# Patient Record
Sex: Female | Born: 1960 | Race: Black or African American | Hispanic: No | Marital: Married | Smoking: Never smoker
Health system: Southern US, Community
[De-identification: ages and names within clinical notes are randomized; demographics above are authoritative.]

## PROBLEM LIST (undated history)

## (undated) DIAGNOSIS — D649 Anemia, unspecified: Secondary | ICD-10-CM

## (undated) DIAGNOSIS — H409 Unspecified glaucoma: Secondary | ICD-10-CM

## (undated) DIAGNOSIS — N946 Dysmenorrhea, unspecified: Secondary | ICD-10-CM

## (undated) DIAGNOSIS — B009 Herpesviral infection, unspecified: Secondary | ICD-10-CM

## (undated) DIAGNOSIS — Z87442 Personal history of urinary calculi: Secondary | ICD-10-CM

## (undated) DIAGNOSIS — E041 Nontoxic single thyroid nodule: Secondary | ICD-10-CM

## (undated) HISTORY — DX: Herpesviral infection, unspecified: B00.9

## (undated) HISTORY — DX: Nontoxic single thyroid nodule: E04.1

## (undated) HISTORY — PX: FOOT SURGERY: SHX648

## (undated) HISTORY — DX: Unspecified glaucoma: H40.9

## (undated) HISTORY — PX: BREAST SURGERY: SHX581

## (undated) HISTORY — PX: NOVASURE ABLATION: SHX5394

## (undated) HISTORY — DX: Dysmenorrhea, unspecified: N94.6

## (undated) HISTORY — DX: Anemia, unspecified: D64.9

---

## 1998-01-02 ENCOUNTER — Ambulatory Visit (HOSPITAL_COMMUNITY): Admission: RE | Admit: 1998-01-02 | Discharge: 1998-01-02 | Payer: Self-pay | Admitting: Obstetrics and Gynecology

## 1999-11-09 ENCOUNTER — Other Ambulatory Visit: Admission: RE | Admit: 1999-11-09 | Discharge: 1999-11-09 | Payer: Self-pay | Admitting: Obstetrics and Gynecology

## 1999-11-10 ENCOUNTER — Encounter (INDEPENDENT_AMBULATORY_CARE_PROVIDER_SITE_OTHER): Payer: Self-pay

## 1999-11-10 ENCOUNTER — Other Ambulatory Visit: Admission: RE | Admit: 1999-11-10 | Discharge: 1999-11-10 | Payer: Self-pay | Admitting: Obstetrics and Gynecology

## 2001-12-26 ENCOUNTER — Other Ambulatory Visit: Admission: RE | Admit: 2001-12-26 | Discharge: 2001-12-26 | Payer: Self-pay | Admitting: Obstetrics and Gynecology

## 2002-01-02 ENCOUNTER — Ambulatory Visit (HOSPITAL_COMMUNITY): Admission: RE | Admit: 2002-01-02 | Discharge: 2002-01-02 | Payer: Self-pay | Admitting: Obstetrics and Gynecology

## 2002-01-02 ENCOUNTER — Encounter: Payer: Self-pay | Admitting: Obstetrics and Gynecology

## 2002-10-17 HISTORY — PX: BREAST EXCISIONAL BIOPSY: SUR124

## 2003-01-07 ENCOUNTER — Other Ambulatory Visit: Admission: RE | Admit: 2003-01-07 | Discharge: 2003-01-07 | Payer: Self-pay | Admitting: Obstetrics and Gynecology

## 2003-01-10 ENCOUNTER — Encounter: Payer: Self-pay | Admitting: Obstetrics and Gynecology

## 2003-01-10 ENCOUNTER — Ambulatory Visit (HOSPITAL_COMMUNITY): Admission: RE | Admit: 2003-01-10 | Discharge: 2003-01-10 | Payer: Self-pay | Admitting: Obstetrics and Gynecology

## 2004-01-15 ENCOUNTER — Other Ambulatory Visit: Admission: RE | Admit: 2004-01-15 | Discharge: 2004-01-15 | Payer: Self-pay | Admitting: Obstetrics and Gynecology

## 2004-01-15 ENCOUNTER — Ambulatory Visit (HOSPITAL_COMMUNITY): Admission: RE | Admit: 2004-01-15 | Discharge: 2004-01-15 | Payer: Self-pay | Admitting: Obstetrics and Gynecology

## 2004-12-24 ENCOUNTER — Other Ambulatory Visit: Admission: RE | Admit: 2004-12-24 | Discharge: 2004-12-24 | Payer: Self-pay | Admitting: Obstetrics and Gynecology

## 2005-01-21 ENCOUNTER — Ambulatory Visit (HOSPITAL_COMMUNITY): Admission: RE | Admit: 2005-01-21 | Discharge: 2005-01-21 | Payer: Self-pay | Admitting: Obstetrics and Gynecology

## 2006-01-23 ENCOUNTER — Other Ambulatory Visit: Admission: RE | Admit: 2006-01-23 | Discharge: 2006-01-23 | Payer: Self-pay | Admitting: Obstetrics and Gynecology

## 2006-01-26 ENCOUNTER — Ambulatory Visit (HOSPITAL_COMMUNITY): Admission: RE | Admit: 2006-01-26 | Discharge: 2006-01-26 | Payer: Self-pay | Admitting: Obstetrics and Gynecology

## 2007-01-29 ENCOUNTER — Ambulatory Visit (HOSPITAL_COMMUNITY): Admission: RE | Admit: 2007-01-29 | Discharge: 2007-01-29 | Payer: Self-pay | Admitting: Obstetrics and Gynecology

## 2008-01-30 ENCOUNTER — Ambulatory Visit (HOSPITAL_COMMUNITY): Admission: RE | Admit: 2008-01-30 | Discharge: 2008-01-30 | Payer: Self-pay | Admitting: Obstetrics and Gynecology

## 2009-01-30 ENCOUNTER — Ambulatory Visit (HOSPITAL_COMMUNITY): Admission: RE | Admit: 2009-01-30 | Discharge: 2009-01-30 | Payer: Self-pay | Admitting: Obstetrics and Gynecology

## 2010-02-04 ENCOUNTER — Ambulatory Visit (HOSPITAL_COMMUNITY): Admission: RE | Admit: 2010-02-04 | Discharge: 2010-02-04 | Payer: Self-pay | Admitting: Obstetrics and Gynecology

## 2010-12-10 ENCOUNTER — Other Ambulatory Visit (HOSPITAL_COMMUNITY): Payer: Self-pay | Admitting: Obstetrics and Gynecology

## 2010-12-10 DIAGNOSIS — Z1231 Encounter for screening mammogram for malignant neoplasm of breast: Secondary | ICD-10-CM

## 2011-02-10 ENCOUNTER — Ambulatory Visit (HOSPITAL_COMMUNITY)
Admission: RE | Admit: 2011-02-10 | Discharge: 2011-02-10 | Disposition: A | Discharge status: Home / Self Care | Payer: 59 | Source: Ambulatory Visit | Attending: Obstetrics and Gynecology | Admitting: Obstetrics and Gynecology

## 2011-02-10 DIAGNOSIS — Z1231 Encounter for screening mammogram for malignant neoplasm of breast: Secondary | ICD-10-CM | POA: Insufficient documentation

## 2011-02-11 ENCOUNTER — Other Ambulatory Visit: Payer: Self-pay | Admitting: Obstetrics and Gynecology

## 2011-02-11 DIAGNOSIS — R928 Other abnormal and inconclusive findings on diagnostic imaging of breast: Secondary | ICD-10-CM

## 2011-02-16 ENCOUNTER — Ambulatory Visit
Admission: RE | Admit: 2011-02-16 | Discharge: 2011-02-16 | Disposition: A | Discharge status: Home / Self Care | Payer: 59 | Source: Ambulatory Visit | Attending: Obstetrics and Gynecology | Admitting: Obstetrics and Gynecology

## 2011-02-16 DIAGNOSIS — R928 Other abnormal and inconclusive findings on diagnostic imaging of breast: Secondary | ICD-10-CM

## 2012-01-05 ENCOUNTER — Other Ambulatory Visit: Payer: Self-pay | Admitting: Obstetrics and Gynecology

## 2012-01-05 DIAGNOSIS — Z1231 Encounter for screening mammogram for malignant neoplasm of breast: Secondary | ICD-10-CM

## 2012-02-10 ENCOUNTER — Ambulatory Visit: Payer: Self-pay | Admitting: Obstetrics and Gynecology

## 2012-02-13 ENCOUNTER — Ambulatory Visit (HOSPITAL_COMMUNITY)
Admission: RE | Admit: 2012-02-13 | Discharge: 2012-02-13 | Disposition: A | Discharge status: Home / Self Care | Payer: 59 | Source: Ambulatory Visit | Attending: Obstetrics and Gynecology | Admitting: Obstetrics and Gynecology

## 2012-02-13 DIAGNOSIS — Z1231 Encounter for screening mammogram for malignant neoplasm of breast: Secondary | ICD-10-CM | POA: Insufficient documentation

## 2012-02-15 ENCOUNTER — Ambulatory Visit: Payer: Self-pay | Admitting: Obstetrics and Gynecology

## 2012-02-17 ENCOUNTER — Encounter: Payer: Self-pay | Admitting: Obstetrics and Gynecology

## 2012-02-17 ENCOUNTER — Ambulatory Visit (INDEPENDENT_AMBULATORY_CARE_PROVIDER_SITE_OTHER): Payer: 59 | Admitting: Obstetrics and Gynecology

## 2012-02-17 VITALS — BP 138/80 | Ht 66.5 in | Wt 213.0 lb

## 2012-02-17 DIAGNOSIS — Z01419 Encounter for gynecological examination (general) (routine) without abnormal findings: Secondary | ICD-10-CM

## 2012-02-17 DIAGNOSIS — D259 Leiomyoma of uterus, unspecified: Secondary | ICD-10-CM

## 2012-02-17 DIAGNOSIS — D219 Benign neoplasm of connective and other soft tissue, unspecified: Secondary | ICD-10-CM

## 2012-02-17 DIAGNOSIS — D649 Anemia, unspecified: Secondary | ICD-10-CM

## 2012-02-17 DIAGNOSIS — Z124 Encounter for screening for malignant neoplasm of cervix: Secondary | ICD-10-CM

## 2012-02-17 DIAGNOSIS — R635 Abnormal weight gain: Secondary | ICD-10-CM

## 2012-02-17 NOTE — Progress Notes (Signed)
Regular Periods: no Mammogram: yes  Monthly Breast Ex.: no Exercise: no  Tetanus < 10 years: yes Seatbelts: yes  NI. Bladder Functn.: yes Abuse at home: no  Daily BM's: yes Stressful Work: yes  Healthy Diet: yes Sigmoid-Colonoscopy: never time for colonscopy  Calcium: yes Medical problems this year: no problems   LAST PAP04/2012   nl Contraception:none pt  Has ablation  Mammogram: 02/15/2012  PCP: none PMH: no change FMH: no change Last Bone Scan:never  Pt is married

## 2012-02-17 NOTE — Progress Notes (Signed)
Subjective:    Kathleen Burton is a 51 y.o. female, G1P1, who presents for an annual exam. Patient has concerns about weight gain but admits to having very little control over eating.  Attributes much of this to her son going off to school and not having anything with which to fill her days.  Discussed her "empty nest" syndrome in terms of grieving.  Urged professional or support group assistance with this transition.      Menstrual cycle:   LMP: No LMP recorded. Patient has had an ablation.   Review of Systems Pertinent items are noted in HPI. Breast:Negative for breast lump,nipple discharge or nipple retraction Gastrointestinal: Negative for abdominal pain, change in bowel habits or rectal bleeding Genitourinary:no abnormal vaginal bleeding, urinary urgency, frequency, dysuria or hematuria   Objective:    BP 138/80  Ht 5' 6.5" (1.689 m)  Wt 213 lb (96.616 kg)  BMI 33.86 kg/m2   Wt Readings from Last 1 Encounters:  02/17/12 213 lb (96.616 kg)          BMI: Body mass index is 33.86 kg/(m^2).  General Appearance: Alert, appropriate appearance for age. No acute distress HEENT: Grossly normal Neck / Thyroid: Supple, no masses, nodes or enlargement Lungs: clear to auscultation bilaterally Back: No CVA tenderness Breast Exam: No masses or nodes.No dimpling, nipple retraction or discharge Cardiovascular: Regular rate and rhythm. S1, S2, no murmur Gastrointestinal: Soft, non-tender, no masses or organomegaly Pelvic Exam: External genitalia: normal general appearance Vaginal: normal rugae Cervix: normal appearance Adnexa: normal bimanual exam Uterus: normal single, nontender Rectovaginal: normal rectal, no masses Lymphatic Exam: Non-palpable nodes in neck, clavicular, axillary, or inguinal regions Skin: no rash or abnormalities Neurologic: Normal gait and speech, no tremor  Psychiatric: Alert and oriented, appropriate affect.  Extremities: no clubbing, cyanosis or edema      Assessment:    Routine GYN Exam  Weight Gain Empty Nest Syndrome   Plan:  Encouraged professional or support group assistance with the "empty nest" transition  mammogram pap smear return annually or prn     Aryella Besecker, PA-C

## 2012-02-22 LAB — PAP IG W/ RFLX HPV ASCU

## 2012-03-20 ENCOUNTER — Encounter: Payer: Self-pay | Admitting: Obstetrics and Gynecology

## 2013-01-14 ENCOUNTER — Other Ambulatory Visit: Payer: Self-pay | Admitting: Obstetrics and Gynecology

## 2013-01-14 DIAGNOSIS — Z1231 Encounter for screening mammogram for malignant neoplasm of breast: Secondary | ICD-10-CM

## 2013-02-13 ENCOUNTER — Ambulatory Visit (HOSPITAL_COMMUNITY)
Admission: RE | Admit: 2013-02-13 | Discharge: 2013-02-13 | Disposition: A | Discharge status: Home / Self Care | Payer: 59 | Source: Ambulatory Visit | Attending: Obstetrics and Gynecology | Admitting: Obstetrics and Gynecology

## 2013-02-13 DIAGNOSIS — Z1231 Encounter for screening mammogram for malignant neoplasm of breast: Secondary | ICD-10-CM | POA: Insufficient documentation

## 2013-04-01 ENCOUNTER — Ambulatory Visit (INDEPENDENT_AMBULATORY_CARE_PROVIDER_SITE_OTHER): Payer: 59 | Admitting: Internal Medicine

## 2013-04-01 ENCOUNTER — Encounter: Payer: Self-pay | Admitting: Internal Medicine

## 2013-04-01 VITALS — BP 128/82 | HR 79 | Temp 98.0°F | Wt 177.0 lb

## 2013-04-01 DIAGNOSIS — E049 Nontoxic goiter, unspecified: Secondary | ICD-10-CM

## 2013-04-01 DIAGNOSIS — E010 Iodine-deficiency related diffuse (endemic) goiter: Secondary | ICD-10-CM

## 2013-04-01 DIAGNOSIS — E041 Nontoxic single thyroid nodule: Secondary | ICD-10-CM | POA: Insufficient documentation

## 2013-04-01 NOTE — Progress Notes (Signed)
  Subjective:    Patient ID: Kathleen Burton, female    DOB: 11/16/1960, 52 y.o.   MRN: [id number]  HPI Acute visit, new patient. About a week ago the patient noted a lump at  the anterior neck when she swallows. There lump is nontender and she is not sure if it is getting larger since last week.  Past Medical History  Diagnosis Date  . Anemia   . Herpes   . Dysmenorrhea   . Thyroid disease     tested by gyn, hyper , hypo?   Past Surgical History  Procedure Laterality Date  . Novasure ablation    . Breast surgery      biospy  . Foot surgery Bilateral    History   Social History  . Marital Status: Married    Spouse Name: N/A    Number of Children: 1  . Years of Education: N/A   Occupational History  . Tech [employer]   Social History Main Topics  . Smoking status: Never Smoker   . Smokeless tobacco: Never Used  . Alcohol Use: No  . Drug Use: No  . Sexually Active: Yes    Birth Control/ Protection: Other-see comments   Other Topics Concern  . Not on file   Social History Narrative  . No narrative on file   Family History  Problem Relation Age of Onset  . Hypertension Mother   . Leukemia Mother     decreased  . Diabetes Brother   . Hypertension Brother     decreased  . Breast cancer Maternal Aunt     deceased  . Breast cancer Cousin     deceased  . Hypothyroidism Sister     had thyroid surgery  . Hypothyroidism Other      Review of Systems No  fever chills. Not recent URI. Weight is at baseline within few pounds. Denies any difficulty breathing or difficulty swallowing.     Objective:   Physical Exam BP 128/82  Pulse 79  Temp(Src) 98 F (36.7 C) (Oral)  Wt 177 lb (80.287 kg)  BMI 28.14 kg/m2  SpO2 97%  General -- alert, well-developed,NAD.   Neck --no lymphadenopathies,  on exam, when she swallows and elbow to palpate a thyroid gland, it is enlarged, nontender, not nodular, firm but not hard.  Lungs -- normal respiratory effort,  no intercostal retractions, no accessory muscle use, and normal breath sounds.   Heart-- normal rate, regular rhythm, no murmur, and no gallop.    Neurologic-- alert & oriented X3 and strength normal in all extremities. Psych-- Cognition and judgment appear intact. Alert and cooperative with normal attention span and concentration.  not anxious appearing and not depressed appearing.           Assessment & Plan:

## 2013-04-01 NOTE — Assessment & Plan Note (Signed)
Patient is found to have thyromegaly, become aware of this last week. Etiology unclear. Patient is concerned about cancer, will proceed with ultrasound and labs. Further advice would result .

## 2013-04-01 NOTE — Patient Instructions (Addendum)
Will schedule a thyroid ultrasound, will also call you with the results of labs

## 2013-04-02 ENCOUNTER — Ambulatory Visit: Payer: 59 | Admitting: Family Medicine

## 2013-04-02 LAB — CBC WITH DIFFERENTIAL/PLATELET
Basophils Relative: 1.4 % (ref 0.0–3.0)
Eosinophils Absolute: 0.1 10*3/uL (ref 0.0–0.7)
HCT: 38 % (ref 36.0–46.0)
Hemoglobin: 12.3 g/dL (ref 12.0–15.0)
Lymphocytes Relative: 28.1 % (ref 12.0–46.0)
Lymphs Abs: 1.4 10*3/uL (ref 0.7–4.0)
MCHC: 32.3 g/dL (ref 30.0–36.0)
MCV: 81 fl (ref 78.0–100.0)
Monocytes Absolute: 0.2 10*3/uL (ref 0.1–1.0)
Neutro Abs: 3.3 10*3/uL (ref 1.4–7.7)
RBC: 4.69 Mil/uL (ref 3.87–5.11)
RDW: 14.5 % (ref 11.5–14.6)

## 2013-04-02 LAB — BASIC METABOLIC PANEL
BUN: 14 mg/dL (ref 6–23)
Calcium: 10.5 mg/dL (ref 8.4–10.5)
Chloride: 112 mEq/L (ref 96–112)
Creatinine, Ser: 0.9 mg/dL (ref 0.4–1.2)

## 2013-04-02 LAB — T3, FREE: T3, Free: 2.5 pg/mL (ref 2.3–4.2)

## 2013-04-03 ENCOUNTER — Encounter: Payer: Self-pay | Admitting: Internal Medicine

## 2013-04-05 ENCOUNTER — Ambulatory Visit (HOSPITAL_COMMUNITY): Payer: 59

## 2013-04-05 ENCOUNTER — Ambulatory Visit (HOSPITAL_COMMUNITY)
Admission: RE | Admit: 2013-04-05 | Discharge: 2013-04-05 | Disposition: A | Discharge status: Home / Self Care | Payer: 59 | Source: Ambulatory Visit | Attending: Internal Medicine | Admitting: Internal Medicine

## 2013-04-05 DIAGNOSIS — E049 Nontoxic goiter, unspecified: Secondary | ICD-10-CM | POA: Insufficient documentation

## 2013-04-05 DIAGNOSIS — E010 Iodine-deficiency related diffuse (endemic) goiter: Secondary | ICD-10-CM

## 2013-04-09 ENCOUNTER — Telehealth: Payer: Self-pay | Admitting: *Deleted

## 2013-04-09 DIAGNOSIS — E041 Nontoxic single thyroid nodule: Secondary | ICD-10-CM

## 2013-04-09 NOTE — Telephone Encounter (Signed)
Message copied by Nada Maclachlan on Tue Apr 09, 2013 11:48 AM ------      Message from: Willow Ora E      Created: Fri Apr 05, 2013  5:02 PM       Lillia Abed, please schedule a thyroid biopsy with interventional radiology--- dx thyroid nodule      The patient is very anxious, could they  do that next week??      ---      I discussed this w/ the patient, her TSH is a slightly suppressed , T3-T4 normal.      Repeat TFTs in 2 months.      Patient in agreement ------

## 2013-04-09 NOTE — Telephone Encounter (Signed)
Orders entered

## 2013-04-16 ENCOUNTER — Other Ambulatory Visit (HOSPITAL_COMMUNITY)
Admission: RE | Admit: 2013-04-16 | Discharge: 2013-04-16 | Disposition: A | Discharge status: Home / Self Care | Payer: 59 | Source: Ambulatory Visit | Attending: Interventional Radiology | Admitting: Interventional Radiology

## 2013-04-16 ENCOUNTER — Ambulatory Visit
Admission: RE | Admit: 2013-04-16 | Discharge: 2013-04-16 | Disposition: A | Discharge status: Home / Self Care | Payer: 59 | Source: Ambulatory Visit | Attending: Internal Medicine | Admitting: Internal Medicine

## 2013-04-16 DIAGNOSIS — E041 Nontoxic single thyroid nodule: Secondary | ICD-10-CM

## 2013-04-16 DIAGNOSIS — E049 Nontoxic goiter, unspecified: Secondary | ICD-10-CM | POA: Insufficient documentation

## 2013-04-24 ENCOUNTER — Telehealth: Payer: Self-pay | Admitting: Internal Medicine

## 2013-04-24 DIAGNOSIS — E041 Nontoxic single thyroid nodule: Secondary | ICD-10-CM

## 2013-04-24 NOTE — Telephone Encounter (Signed)
Advise patient, biopsy showed benign tissue, this is good news. I do recommend her to see a specialist for consideration of treatment of the thyroid nodule. Please arrange a referral to see Dr. Everardo All.

## 2013-04-24 NOTE — Telephone Encounter (Signed)
Patient is calling to follow-up on the results from her [place] on 04/16/13.

## 2013-04-24 NOTE — Telephone Encounter (Signed)
Please advise on US results.

## 2013-04-25 NOTE — Telephone Encounter (Signed)
Discussed with patient, verbalized understanding. Referral orders placed.  

## 2013-04-25 NOTE — Telephone Encounter (Signed)
lmovm to return call  °

## 2013-05-01 ENCOUNTER — Encounter: Payer: Self-pay | Admitting: Endocrinology

## 2013-05-01 ENCOUNTER — Ambulatory Visit (INDEPENDENT_AMBULATORY_CARE_PROVIDER_SITE_OTHER): Payer: 59 | Admitting: Endocrinology

## 2013-05-01 VITALS — BP 118/74 | HR 75 | Resp 12 | Ht 66.0 in | Wt 186.1 lb

## 2013-05-01 DIAGNOSIS — E041 Nontoxic single thyroid nodule: Secondary | ICD-10-CM

## 2013-05-01 NOTE — Progress Notes (Signed)
Patient ID: Kathleen Burton, female   DOB: 11/16/1960, 52 y.o.   MRN: [id number]  History of Present Illness:  THYROID nodule: The patient apparently discovered a lump in neck 6//1214 while looking at the mirror. She did not have any local discomfort, pressure sensation and went to her primary care physician for evaluation. On examination she was felt to have a thyroid nodule and an ultrasound was done which showed a dominant nodule in the isthmus. She was then referred for biopsy which was benign and described below:  THYROID, FINE NEEDLE ASPIRATION, ISTHMUS MIDLINE: Adequacy Reason:  Satisfactory For Evaluation. Diagnosis: BENIGN. FINDINGS CONSISTENT WITH NON-NEOPLASTIC GOITER. (Case signed 04/17/2013) Specimen Clinical Information Dominant solid and partially cystic nodule in the midline isthmus 2.4 x 1.6 x 2.0 cm, Thyromegaly and thyroid goiter on physical examination Source Thyroid, Fine Needle Aspiration, Isthmus Midline  The patient was referred here for further evaluation and followup. She has multiple questions and concerns and has very little understanding of the thyroid nodule and prognosis Again she has had no local pressure or discomfort as well as no difficulty swallowing  Thyroid functions had shown low normal TSH but normal T4 and T3  Past Medical History  Diagnosis Date  . Anemia   . Herpes   . Dysmenorrhea   . Thyroid disease     tested by gyn, hyper , hypo?    Past Surgical History  Procedure Laterality Date  . Novasure ablation    . Breast surgery      biospy  . Foot surgery Bilateral     Family History  Problem Relation Age of Onset  . Hypertension Mother   . Leukemia Mother     decreased  . Diabetes Brother   . Hypertension Brother     decreased  . Breast cancer Maternal Aunt     deceased  . Breast cancer Cousin     deceased  . Hypothyroidism Sister     had thyroid surgery  . Hypothyroidism Other     Social History:  reports that she has  never smoked. She has never used smokeless tobacco. She reports that she does not drink alcohol or use illicit drugs.  Allergies:  Allergies  Allergen Reactions  . Latex Swelling      Medication List       This list is accurate as of: 05/01/13  9:46 AM.  Always use your most recent med list.               calcium carbonate 600 MG Tabs  Commonly known as:  OS-CAL  Take 600 mg by mouth 2 (two) times daily with a meal.     ferrous sulfate 325 (65 FE) MG tablet  Take 325 mg by mouth daily with breakfast.     multivitamin tablet  Take 1 tablet by mouth daily.         Lab Results  Component Value Date   TSH 0.31* 04/01/2013  .  REVIEW OF SYSTEMS:           No history of diabetes     Thyroid:  No cold or heat intolerance, unusual fatigue. She does have some tendency to weight gain      His blood pressure has been normal.     No swelling of feet.     No shortness of breath on exertion.    EXAM:   GENERAL:  No pallor, clubbing, lymphadenopathy or edema.    EYES::   external appearance normal  THYROID:  3 cm smooth firm midline nodule in the sternal notch, rest of the thyroid not palpable.No local stridor.  No lymphadenopathy in the neck  HEART:  Normal apex, S1 and S2; no murmur or click.  CHEST:    Vescicular breath sounds heard equally.  No crepitations/ wheeze.  NEUROLOGICAL: Biceps reflexes are brisk. No tremor   ASSESSMENT:   She has a benign thyroid nodule which is relatively prominent in the isthmus but causing no local pressure symptoms. Discussed that since the nodule is at least 2.5 cm in size it probably has grown slowly for quite some time. Reassured her that the thyroid nodule is benign and does not need surgical intervention. Discussed natural history of nodules and that usually they are slow-growing and would not need surgery unless there are very significantly enlarged and causing local pressure  Although her TSH is low normal she does not  meet criteria for subclinical hyperthyroidism but will need to be followed periodically. Discussed potential for treatment with I-131 if she hyperthyroidism   PLAN:   Since her TSH is borderline low She is not a candidate for thyroid suppression as a method of treatment She will be followed up in 4 months to reassess the size of her nodule clinically and repeat thyroid functions All questions answered  Kathleen Burton 05/01/2013, 9:46 AM

## 2013-05-01 NOTE — Patient Instructions (Signed)
No Treatment

## 2013-05-13 ENCOUNTER — Ambulatory Visit (INDEPENDENT_AMBULATORY_CARE_PROVIDER_SITE_OTHER): Payer: 59 | Admitting: Internal Medicine

## 2013-05-13 ENCOUNTER — Encounter: Payer: Self-pay | Admitting: Internal Medicine

## 2013-05-13 VITALS — BP 130/80 | HR 81 | Temp 97.5°F | Ht 66.5 in | Wt 187.8 lb

## 2013-05-13 DIAGNOSIS — Z0000 Encounter for general adult medical examination without abnormal findings: Secondary | ICD-10-CM | POA: Insufficient documentation

## 2013-05-13 DIAGNOSIS — E041 Nontoxic single thyroid nodule: Secondary | ICD-10-CM

## 2013-05-13 LAB — LIPID PANEL
HDL: 92.9 mg/dL (ref >39.00)
LDL Cholesterol: 92 mg/dL (ref 0–99)
Total CHOL/HDL Ratio: 2
Triglycerides: 39 mg/dL (ref 0.0–149.0)

## 2013-05-13 NOTE — Assessment & Plan Note (Signed)
Ultrasound of the thyroid showed a nodule, biopsy was benign. TSH was slightly suppressed. Saw endocrinology, he recommend observation for now and followup in 4 months.

## 2013-05-13 NOTE — Patient Instructions (Signed)
Please see your endocrinologist in 4 months Exercise   3 hours weekly Next visit with me in one year and as needed

## 2013-05-13 NOTE — Progress Notes (Signed)
  Subjective:    Patient ID: Kathleen Burton, female    DOB: 11/16/1960, 52 y.o.   MRN: [id number]  HPI Complete physical exam  Past Medical History  Diagnosis Date  . Anemia   . Herpes   . Dysmenorrhea   . Thyroid nodule     bx 04-2013   Past Surgical History  Procedure Laterality Date  . Novasure ablation    . Breast surgery      biospy  . Foot surgery Bilateral    History   Social History  . Marital Status: Married    Spouse Name: N/A    Number of Children: 1  . Years of Education: N/A   Occupational History  . dep. 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 Smoking status: Never Smoker   . Smokeless tobacco: Never Used  . Alcohol Use: No  . Drug Use: No  . Sexually Active: Yes    Birth Control/ Protection: Other-see comments   Other Topics Concern  . Not on file   Social History Narrative   Lives w/ husband, child is at college   Family History  Problem Relation Age of Onset  . Hypertension Mother     Judie Petit- brother   . Leukemia Mother     deceased  . Diabetes Brother   . Breast cancer Maternal Aunt     aunt-cousin  . Hypothyroidism Sister     had thyroid surgery  . Colon cancer Neg Hx   . CAD Neg Hx      Review of Systems Diet--healthy on and off Exercise-- does not exercise regularly. Denies chest pain or shortness or breath No  nausea, vomiting, diarrhea or blood in the stools. No dysuria or gross hematuria. Some stress, but no anxiety or depression.     Objective:   Physical Exam BP 130/80  Pulse 81  Temp(Src) 97.5 F (36.4 C) (Oral)  Ht 5' 6.5" (1.689 m)  Wt 187 lb 12.8 oz (85.186 kg)  BMI 29.86 kg/m2  SpO2 97%  General -- alert, well-developed, BMI .   Neck --mild thyromegaly , no tender or nodular  Lungs -- normal respiratory effort, no intercostal retractions, no accessory muscle use, and normal breath sounds.   Heart-- normal rate, regular rhythm, no murmur, and no gallop.    Abdomen--soft, non-tender, no distention, no masses, no HSM, no guarding, and no rigidity.   Extremities-- no pretibial edema bilaterally  Neurologic-- alert & oriented X3 and strength normal in all extremities. Psych-- Cognition and judgment appear intact. Alert and cooperative with normal attention span and concentration.  not anxious appearing and not depressed appearing.       Assessment & Plan:

## 2013-05-13 NOTE — Assessment & Plan Note (Addendum)
Tdap < 10 years per pt zostavax discussed  Never had a cscope, Colonoscopy versus Hemoccults discussed, IFOB provided, will call when ready for a colonoscopy. EKG for baseline--normal sinus rhythm Sees gynecology regularly Last mammogram 12-2012 normal. Diet exercise discussed Recent labs reviewed, no anemia, recommend to discontinue oral  iron. Check a cholesterol panel

## 2013-05-16 ENCOUNTER — Encounter: Payer: Self-pay | Admitting: *Deleted

## 2013-08-23 ENCOUNTER — Other Ambulatory Visit: Payer: Self-pay | Admitting: *Deleted

## 2013-08-23 ENCOUNTER — Other Ambulatory Visit (INDEPENDENT_AMBULATORY_CARE_PROVIDER_SITE_OTHER): Payer: 59

## 2013-08-23 DIAGNOSIS — E041 Nontoxic single thyroid nodule: Secondary | ICD-10-CM

## 2013-08-23 LAB — TSH: TSH: 0.12 u[IU]/mL — ABNORMAL LOW (ref 0.35–5.50)

## 2013-08-27 ENCOUNTER — Ambulatory Visit (INDEPENDENT_AMBULATORY_CARE_PROVIDER_SITE_OTHER): Payer: 59 | Admitting: Endocrinology

## 2013-08-27 ENCOUNTER — Other Ambulatory Visit: Payer: Self-pay | Admitting: *Deleted

## 2013-08-27 ENCOUNTER — Encounter: Payer: Self-pay | Admitting: Endocrinology

## 2013-08-27 VITALS — BP 126/78 | HR 77 | Temp 98.3°F | Resp 12 | Ht 66.0 in | Wt 202.8 lb

## 2013-08-27 DIAGNOSIS — E041 Nontoxic single thyroid nodule: Secondary | ICD-10-CM

## 2013-08-27 MED ORDER — PHENTERMINE-TOPIRAMATE ER 7.5-46 MG PO CP24: 7.5000 mg | ORAL_CAPSULE | Freq: Every day | ORAL | Status: DC

## 2013-08-27 MED ORDER — PHENTERMINE-TOPIRAMATE ER 3.75-23 MG PO CP24: 3.7500 mg | ORAL_CAPSULE | Freq: Every day | ORAL | Status: DC

## 2013-08-27 NOTE — Patient Instructions (Signed)
Start exercise with aerobic 45 min 4 days a week

## 2013-08-27 NOTE — Progress Notes (Signed)
Patient ID: Kathleen Burton, female   DOB: 11/16/1960, 52 y.o.   MRN: [id number]  Chief complaint:THYROID nodule  History of Present Illness:  She apparently discovered a lump in neck on 6//1214 while looking at the mirror. She did not have any local discomfort, pressure sensation and went to Kathleen Burton primary care physician for evaluation. On examination she was felt to have a thyroid nodule and an ultrasound was done which showed a dominant nodule in the isthmus measuring 2.4 cm. She was then referred for biopsy which was benign and described below:  THYROID, FINE NEEDLE ASPIRATION, ISTHMUS MIDLINE, 04/17/13: Adequacy Reason:  Satisfactory For Evaluation. Diagnosis: BENIGN. FINDINGS CONSISTENT WITH NON-NEOPLASTIC GOITER.  She has not had a thyroid scan  Again she has had no local pressure or discomfort as well as no difficulty swallowing Does not think she has noticed any change in the size of Kathleen Burton nodule.  Thyroid functions had shown low normal TSH but normal T4 and T3  Past Medical History  Diagnosis Date  . Anemia   . Herpes   . Dysmenorrhea   . Thyroid nodule     bx 04-2013    Past Surgical History  Procedure Laterality Date  . Novasure ablation    . Breast surgery      biospy  . Foot surgery Bilateral     Family History  Problem Relation Age of Onset  . Hypertension Mother     Judie Petit- brother   . Leukemia Mother     deceased  . Diabetes Brother   . Breast cancer Maternal Aunt     aunt-cousin  . Hypothyroidism Sister     had thyroid surgery  . Colon cancer Neg Hx   . CAD Neg Hx     Social History:  reports that she has never smoked. She has never used smokeless tobacco. She reports that she does not drink alcohol or use illicit drugs.  Allergies:  Allergies  Allergen Reactions  . Latex Swelling      Medication List       This list is accurate as of: 08/27/13 11:12 AM.  Always use your most recent med list.               calcium carbonate 600 MG Tabs  tablet  Commonly known as:  OS-CAL  Take 600 mg by mouth 2 (two) times daily with a meal.     multivitamin tablet  Take 1 tablet by mouth daily.         Lab Results  Component Value Date   TSH 0.12* 08/23/2013  .  REVIEW OF SYSTEMS:           No history of diabetes     His blood pressure has been normal.     She does have  tendency to weight gain. She has gained significant weight on this visit and is very upset about this She does not exercise but is wanting to take a weight loss medication to help Kathleen Burton start to weight loss process   Wt Readings from Last 3 Encounters:  08/27/13 202 lb 12.8 oz (91.989 kg)  05/13/13 187 lb 12.8 oz (85.186 kg)  05/01/13 186 lb 1.6 oz (84.414 kg)     EXAM:   THYROID:  3.2 cm smooth firm midline nodule in the sternal notch, rest of the thyroid not palpable.  No lymphadenopathy in the neck  NEUROLOGICAL: Biceps reflexes are normal   ASSESSMENT:   She has a benign thyroid nodule  which is relatively prominent in the isthmus but causing no local pressure symptoms. Clinically it appears about the same Again Kathleen Burton TSH is low and appears to be relatively lower than the last time but has a normal free T4 She does not have any signs or symptoms of hyperthyroidism She may possibly have warm/hot nodule but thyroid scan has not been done so far Discussed potential for treatment with I-131 if she hyperthyroidism   Weight gain: Advised the patient that this is not related to Kathleen Burton thyroid and she does need to do much better with exercise regimen as well as consider seeing the dietitian. She insists on starting a weight loss medication today  PLAN:    Continue followup of thyroid and will also check free T3 on the next visit She'll be started on Qsymia, initially with 3.75 mg for 2 weeks and then 7.5 mg Discussed how this works and possible side effects, since she is perimenopausal there is no concern for pregnancy Encouraged Kathleen Burton to start exercise  program  [facility] 08/27/2013, 11:12 AM

## 2013-09-02 ENCOUNTER — Ambulatory Visit: Payer: 59 | Admitting: Endocrinology

## 2013-09-09 ENCOUNTER — Telehealth: Payer: Self-pay | Admitting: *Deleted

## 2013-09-09 ENCOUNTER — Telehealth: Payer: Self-pay | Admitting: Endocrinology

## 2013-09-09 NOTE — Telephone Encounter (Signed)
Pt states she would like to speak with nurse Pt also states she has been calling past 2 weeks regarding her medication  Call back [phone]  Thank you :)

## 2013-09-09 NOTE — Telephone Encounter (Signed)
Patient wants to know if you can prescribe two different medications that would make up the ingredients of Qysmia, the Qysmia is to expensive. She has printed 4 different coupons and none of them work.   Please advise.

## 2013-09-10 ENCOUNTER — Other Ambulatory Visit: Payer: Self-pay | Admitting: *Deleted

## 2013-09-10 MED ORDER — TOPIRAMATE 50 MG PO TABS: ORAL_TABLET | ORAL | Status: DC

## 2013-09-10 MED ORDER — PHENTERMINE HCL 15 MG PO CAPS: 15.0000 mg | ORAL_CAPSULE | ORAL | Status: DC

## 2013-09-10 NOTE — Telephone Encounter (Signed)
Phentermine 15 mg every other day alternating with Topamax 50 mg, half to 2 weeks increase Topamax to daily and stay on phentermine every other day. She will need to followup in one month for office visit to review progress.

## 2013-09-10 NOTE — Telephone Encounter (Signed)
Patient aware, rx has been sent to her pharmacy

## 2013-10-07 ENCOUNTER — Ambulatory Visit: Payer: 59 | Admitting: Endocrinology

## 2013-10-08 ENCOUNTER — Encounter: Payer: Self-pay | Admitting: Endocrinology

## 2013-10-08 ENCOUNTER — Ambulatory Visit (INDEPENDENT_AMBULATORY_CARE_PROVIDER_SITE_OTHER): Payer: 59 | Admitting: Endocrinology

## 2013-10-08 ENCOUNTER — Other Ambulatory Visit: Payer: Self-pay | Admitting: *Deleted

## 2013-10-08 VITALS — BP 128/80 | HR 84 | Temp 98.3°F | Resp 12 | Ht 66.0 in | Wt 201.3 lb

## 2013-10-08 DIAGNOSIS — R635 Abnormal weight gain: Secondary | ICD-10-CM

## 2013-10-08 MED ORDER — PHENTERMINE HCL 15 MG PO CAPS: 15.0000 mg | ORAL_CAPSULE | Freq: Every day | ORAL | Status: DC

## 2013-10-08 NOTE — Progress Notes (Signed)
Kathleen Burton is a 52 y.o. female.   Chief complaint: Need for weight loss  History of Present Illness:   She had gained about 15 pounds on her last visit and was insisting on trying a weight loss medication She has not had any formal dietitian consultation and no exercise regimen She thinks she has a fairly good diet She was asked to try Qsymia but because of lack of coverage she was given the equivalent doses  in generic form She does not think it has changed her satiety and her weight has not come down; however has stabilized. No side effects from either medication  Wt Readings from Last 3 Encounters:  10/08/13 201 lb 4.8 oz (91.309 kg)  08/27/13 202 lb 12.8 oz (91.989 kg)  05/13/13 187 lb 12.8 oz (85.186 kg)     Medication List       This list is accurate as of: 10/08/13 11:50 AM.  Always use your most recent med list.               calcium carbonate 600 MG Tabs tablet  Commonly known as:  OS-CAL  Take 600 mg by mouth 2 (two) times daily with a meal.     multivitamin tablet  Take 1 tablet by mouth daily.     phentermine 15 MG capsule  Take 1 capsule (15 mg total) by mouth every other day.     topiramate 50 MG tablet  Commonly known as:  TOPAMAX  Take 1 tablet every other day for 2 weeks, then increase to 1 tablet daily        Allergies:  Allergies  Allergen Reactions  . Latex Swelling    Past Medical History  Diagnosis Date  . Anemia   . Herpes   . Dysmenorrhea   . Thyroid nodule     bx 04-2013    Past Surgical History  Procedure Laterality Date  . Novasure ablation    . Breast surgery      biospy  . Foot surgery Bilateral     Family History  Problem Relation Age of Onset  . Hypertension Mother     Judie Petit- brother   . Leukemia Mother     deceased  . Diabetes Brother   . Breast cancer Maternal Aunt     aunt-cousin  . Hypothyroidism Sister     had thyroid surgery  . Colon cancer Neg Hx   . CAD Neg Hx     Social History:  reports that  she has never smoked. She has never used smokeless tobacco. She reports that she does not drink alcohol or use illicit drugs.  Review of Systems   No visits with results within 1 Week(s) from this visit. Latest known visit with results is:  Appointment on 08/23/2013  Component Date Value Range Status  . TSH 08/23/2013 0.12* 0.35 - 5.50 uIU/mL Final  . Free T4 08/23/2013 1.07  0.60 - 1.60 ng/dL Final    EXAM:  BP 161/09  Pulse 97  Temp(Src) 98.3 F (36.8 C)  Resp 12  Ht 5\' 6"  (1.676 m)  Wt 201 lb 4.8 oz (91.309 kg)  BMI 32.51 kg/m2  SpO2 95%  Repeat pulse 80  Assessment/Plan:   Obesity: She is trying combination of low-dose phentermine and Topamax every other day which is not helping her weight loss However she has not been exercising more trying to watch her diet Discussed that higher dose phentermine is not recommended for long-term weight loss She  is currently only interested in medications that are covered by her insurance and have low co-pay  For now will increase her phentermine and Topamax every day which will be given to the next higher dose of Qsymia Given her information on Belviq and Contrave and she will call back if these are economical enough for her to use Also stressed the importance of starting exercise like walking program to help weight loss and maintain it long term  Chelcee Korpi 10/08/2013, 11:50 AM

## 2013-10-08 NOTE — Patient Instructions (Signed)
Take both drugs daily

## 2013-12-06 ENCOUNTER — Encounter: Payer: Self-pay | Admitting: Endocrinology

## 2013-12-06 ENCOUNTER — Ambulatory Visit (INDEPENDENT_AMBULATORY_CARE_PROVIDER_SITE_OTHER): Payer: 59 | Admitting: Endocrinology

## 2013-12-06 VITALS — BP 122/78 | HR 91 | Temp 98.2°F | Resp 16 | Ht 66.0 in | Wt 184.4 lb

## 2013-12-06 DIAGNOSIS — E669 Obesity, unspecified: Secondary | ICD-10-CM

## 2013-12-06 DIAGNOSIS — E041 Nontoxic single thyroid nodule: Secondary | ICD-10-CM

## 2013-12-06 MED ORDER — TOPIRAMATE 50 MG PO TABS: ORAL_TABLET | ORAL | Status: DC

## 2013-12-06 MED ORDER — PHENTERMINE HCL 15 MG PO CAPS: 15.0000 mg | ORAL_CAPSULE | Freq: Every day | ORAL | Status: DC

## 2013-12-06 NOTE — Progress Notes (Signed)
Kathleen Burton is a 53 y.o. female.    Chief complaint: Followup of weight loss treatment  History of Present Illness:   Because of a weight gain of about 15 pounds and difficulty with losing weight with diet and exercise regimen she had requested weight loss medications. She has not had any formal dietitian consultation  Also was somewhat unmotivated to exercise She was asked to try Qsymia but because of lack of coverage she was given the equivalent doses in generic form She thinks she has a fairly good diet With increasing the dose of phentermine to 15 mg daily and Topamax to 50 mg daily also she has better satiety She has no significant side effects except she thinks Topamax causes more nausea She also has started exercising some Her weight has come down significantly Overall she is feeling more motivated  Wt Readings from Last 3 Encounters:  12/06/13 184 lb 6.4 oz (83.643 kg)  10/08/13 201 lb 4.8 oz (91.309 kg)  08/27/13 202 lb 12.8 oz (91.989 kg)    Problem 2: Thyroid nodule which was biopsied in 04/2013 and was found to be benign. This measured about 3 cm on her l last exam Also has had mildly low TSH without high free T4; has not had a nuclear thyroid scan previously No complaints of palpitations or heat intolerance/shakiness      Medication List       This list is accurate as of: 12/06/13 11:59 PM.  Always use your most recent med list.               calcium carbonate 600 MG Tabs tablet  Commonly known as:  OS-CAL  Take 600 mg by mouth 2 (two) times daily with a meal.     multivitamin tablet  Take 1 tablet by mouth daily.     phentermine 15 MG capsule  Take 1 capsule (15 mg total) by mouth daily.     topiramate 50 MG tablet  Commonly known as:  TOPAMAX  Take 1 tablet every other day for 2 weeks, then increase to 1 tablet daily        Allergies:  Allergies  Allergen Reactions  . Latex Swelling    Past Medical History  Diagnosis Date  . Anemia    . Herpes   . Dysmenorrhea   . Thyroid nodule     bx 04-2013    Past Surgical History  Procedure Laterality Date  . Novasure ablation    . Breast surgery      biospy  . Foot surgery Bilateral     Family History  Problem Relation Age of Onset  . Hypertension Mother     Jerilynn Mages- brother   . Leukemia Mother     deceased  . Diabetes Brother   . Breast cancer Maternal Aunt     aunt-cousin  . Hypothyroidism Sister     had thyroid surgery  . Colon cancer Neg Hx   . CAD Neg Hx     Social History:  reports that she has never smoked. She has never used smokeless tobacco. She reports that she does not drink alcohol or use illicit drugs.  Review of Systems   No history of hypertension or diabetes  LABS:  No visits with results within 1 Week(s) from this visit. Latest known visit with results is:  Appointment on 08/23/2013  Component Date Value Ref Range Status  . TSH 08/23/2013 0.12* 0.35 - 5.50 uIU/mL Final  . Free T4 08/23/2013 1.07  0.60 - 1.60 ng/dL Final    EXAM:  BP 122/78  Pulse 91  Temp(Src) 98.2 F (36.8 C)  Resp 16  Ht 5\' 6"  (1.676 m)  Wt 184 lb 6.4 oz (83.643 kg)  BMI 29.78 kg/m2  SpO2 98%  Repeat pulse 80  Thyroid nodule is about 2.5-3 cm, smooth and firm, palpable in the isthmus area No local lymphadenopathy  Assessment/Plan:   Obesity: She is trying combination of low-dose phentermine and Topamax daily  which is now significantly helping her weight loss Also she has  been exercising recently and is pleased with the benefits. She has not had any side effects and appears to have no change in blood pressure and only minimal increase in pulse  She can continue the current regimen for another 6 months  THYROID nodule: Will check thyroid functions again to rule out subclinical hyperthyroidism  Taron Conrey 12/07/2013, 8:23 PM

## 2013-12-19 ENCOUNTER — Telehealth: Payer: Self-pay | Admitting: Endocrinology

## 2013-12-19 NOTE — Telephone Encounter (Signed)
Pt would like lab results   Call back [phone]  Thank You

## 2013-12-20 NOTE — Telephone Encounter (Signed)
Labs Back to normal now

## 2013-12-20 NOTE — Telephone Encounter (Signed)
Patient is requesting lab results.

## 2014-01-22 ENCOUNTER — Other Ambulatory Visit: Payer: Self-pay | Admitting: Obstetrics and Gynecology

## 2014-01-22 DIAGNOSIS — Z1231 Encounter for screening mammogram for malignant neoplasm of breast: Secondary | ICD-10-CM

## 2014-01-25 ENCOUNTER — Ambulatory Visit (INDEPENDENT_AMBULATORY_CARE_PROVIDER_SITE_OTHER): Payer: 59 | Admitting: Internal Medicine

## 2014-01-25 ENCOUNTER — Encounter: Payer: Self-pay | Admitting: Internal Medicine

## 2014-01-25 VITALS — BP 140/86 | HR 83 | Temp 97.7°F | Resp 20 | Ht 66.0 in | Wt 169.0 lb

## 2014-01-25 DIAGNOSIS — S8010XA Contusion of unspecified lower leg, initial encounter: Secondary | ICD-10-CM

## 2014-01-25 DIAGNOSIS — S8012XA Contusion of left lower leg, initial encounter: Secondary | ICD-10-CM

## 2014-01-25 NOTE — Assessment & Plan Note (Signed)
Reassured her this is not a "blood clot" (DVT) Should resolve on its own  Daily 5PM left thigh pain is certainly a mystery Discussed trying gentle stretching in AM and through the day May need follow up with Dr Larose Kells

## 2014-01-25 NOTE — Progress Notes (Signed)
Pre-visit discussion using our clinic review tool. No additional management support is needed unless otherwise documented below in the visit note.  

## 2014-01-25 NOTE — Progress Notes (Signed)
   Subjective:    Patient ID: Kathleen Burton, female    DOB: 11/16/1960, 53 y.o.   MRN: [id number]  HPI Has had some left leg pain  Daily for the last month Does okay in day but then thigh is "killing me" by the evening Gets shooting pains in evening--always starts at 5PM Pain is gone in the morning  Noticed a bruise in upper left calf a week ago Is tender Has grown since then  Worried about a blood clot  No regular exercise No known injury  Current Outpatient Prescriptions on File Prior to Visit  Medication Sig Dispense Refill  . calcium carbonate (OS-CAL) 600 MG TABS Take 600 mg by mouth 2 (two) times daily with a meal.      . Multiple Vitamin (MULTIVITAMIN) tablet Take 1 tablet by mouth daily.      . phentermine 15 MG capsule Take 1 capsule (15 mg total) by mouth daily.  30 capsule  2  . topiramate (TOPAMAX) 50 MG tablet Take 1 tablet every other day for 2 weeks, then increase to 1 tablet daily  21 tablet  5   No current facility-administered medications on file prior to visit.    Allergies  Allergen Reactions  . Latex Swelling    Past Medical History  Diagnosis Date  . Anemia   . Herpes   . Dysmenorrhea   . Thyroid nodule     bx 04-2013    Past Surgical History  Procedure Laterality Date  . Novasure ablation    . Breast surgery      biospy  . Foot surgery Bilateral     Family History  Problem Relation Age of Onset  . Hypertension Mother     Jerilynn Mages- brother   . Leukemia Mother     deceased  . Diabetes Brother   . Breast cancer Maternal Aunt     aunt-cousin  . Hypothyroidism Sister     had thyroid surgery  . Colon cancer Neg Hx   . CAD Neg Hx     History   Social History  . Marital Status: Married    Spouse Name: N/A    Number of Children: 1  . Years of Education: N/A   Occupational History  . dep. 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 Smoking status: Never Smoker   . Smokeless tobacco:  Never Used  . Alcohol Use: No  . Drug Use: No  . Sexual Activity: Yes    Birth Control/ Protection: Other-see comments   Other Topics Concern  . Not on file   Social History Narrative   Lives w/ husband, child is at college   Review of Systems Wears spanks all the time---not new No fever Hasn't been sick     Objective:   Physical Exam  Musculoskeletal:  Normal ROM in hips No quad tenderness Knees are normal  Small hematoma in upper posterior left calf No inflammation or tenderness  No calf swelling or tenderness (like in deep veins)          Assessment & Plan:

## 2014-02-05 ENCOUNTER — Encounter: Payer: Self-pay | Admitting: Endocrinology

## 2014-02-14 ENCOUNTER — Ambulatory Visit (HOSPITAL_COMMUNITY)
Admission: RE | Admit: 2014-02-14 | Discharge: 2014-02-14 | Disposition: A | Discharge status: Home / Self Care | Payer: 59 | Source: Ambulatory Visit | Attending: Obstetrics and Gynecology | Admitting: Obstetrics and Gynecology

## 2014-02-14 DIAGNOSIS — Z1231 Encounter for screening mammogram for malignant neoplasm of breast: Secondary | ICD-10-CM

## 2014-03-17 ENCOUNTER — Telehealth: Payer: Self-pay | Admitting: Endocrinology

## 2014-03-17 ENCOUNTER — Other Ambulatory Visit: Payer: Self-pay | Admitting: *Deleted

## 2014-03-17 MED ORDER — PHENTERMINE HCL 15 MG PO CAPS: 15.0000 mg | ORAL_CAPSULE | Freq: Every day | ORAL | Status: DC

## 2014-03-17 MED ORDER — TOPIRAMATE 50 MG PO TABS: ORAL_TABLET | ORAL | Status: DC

## 2014-03-17 NOTE — Telephone Encounter (Signed)
Pt will need refills on phentermine she has one refill left and it will be used this week; and the topiromate will need refills she only has 2 refills left and her appt is farther out.  Please change the quantity for the topiromate to 30

## 2014-03-18 ENCOUNTER — Other Ambulatory Visit: Payer: Self-pay | Admitting: *Deleted

## 2014-03-18 MED ORDER — PHENTERMINE HCL 15 MG PO CAPS: 15.0000 mg | ORAL_CAPSULE | Freq: Every day | ORAL | Status: DC

## 2014-03-18 NOTE — Telephone Encounter (Signed)
Please have written rx ready for pt for the phentermine. She does not want that called in. It is too expensive. Please call pt once this is done. Please cancel the rx for the phentermine at [facility].

## 2014-05-15 ENCOUNTER — Other Ambulatory Visit: Payer: Self-pay | Admitting: Endocrinology

## 2014-05-15 MED ORDER — TOPIRAMATE 50 MG PO TABS: 50.0000 mg | ORAL_TABLET | Freq: Two times a day (BID) | ORAL | Status: DC

## 2014-06-03 ENCOUNTER — Other Ambulatory Visit: Payer: 59

## 2014-06-03 ENCOUNTER — Other Ambulatory Visit (INDEPENDENT_AMBULATORY_CARE_PROVIDER_SITE_OTHER): Payer: 59

## 2014-06-03 DIAGNOSIS — E041 Nontoxic single thyroid nodule: Secondary | ICD-10-CM

## 2014-06-03 DIAGNOSIS — E669 Obesity, unspecified: Secondary | ICD-10-CM

## 2014-06-03 LAB — T3, FREE: T3, Free: 2.9 pg/mL (ref 2.3–4.2)

## 2014-06-03 LAB — TSH: TSH: 0.41 u[IU]/mL (ref 0.35–4.50)

## 2014-06-03 LAB — BASIC METABOLIC PANEL
BUN: 10 mg/dL (ref 6–23)
CO2: 22 meq/L (ref 19–32)
Calcium: 10.8 mg/dL — ABNORMAL HIGH (ref 8.4–10.5)
Chloride: 111 mEq/L (ref 96–112)
Creatinine, Ser: 0.7 mg/dL (ref 0.4–1.2)
GFR: 105.76 mL/min (ref >60.00)
GLUCOSE: 77 mg/dL (ref 70–99)
POTASSIUM: 3.9 meq/L (ref 3.5–5.1)
SODIUM: 142 meq/L (ref 135–145)

## 2014-06-03 LAB — T4, FREE: Free T4: 1.12 ng/dL (ref 0.60–1.60)

## 2014-06-06 ENCOUNTER — Other Ambulatory Visit: Payer: Self-pay | Admitting: Endocrinology

## 2014-06-06 ENCOUNTER — Other Ambulatory Visit: Payer: Self-pay | Admitting: *Deleted

## 2014-06-06 ENCOUNTER — Telehealth: Payer: Self-pay | Admitting: Endocrinology

## 2014-06-06 ENCOUNTER — Encounter: Payer: Self-pay | Admitting: Endocrinology

## 2014-06-06 ENCOUNTER — Ambulatory Visit (INDEPENDENT_AMBULATORY_CARE_PROVIDER_SITE_OTHER): Payer: 59 | Admitting: Endocrinology

## 2014-06-06 VITALS — BP 134/82 | HR 107 | Temp 97.6°F | Resp 14 | Ht 66.0 in | Wt 150.6 lb

## 2014-06-06 DIAGNOSIS — E669 Obesity, unspecified: Secondary | ICD-10-CM

## 2014-06-06 DIAGNOSIS — E041 Nontoxic single thyroid nodule: Secondary | ICD-10-CM

## 2014-06-06 MED ORDER — PHENTERMINE HCL 15 MG PO CAPS: 15.0000 mg | ORAL_CAPSULE | Freq: Every day | ORAL | Status: DC

## 2014-06-06 NOTE — Patient Instructions (Addendum)
Phenteremine every 2 days

## 2014-06-06 NOTE — Progress Notes (Addendum)
Kathleen Burton is a 53 y.o. female.    Chief complaint: Followup   History of Present Illness:   Problem 1: Because of a weight gain of about 15 pounds and difficulty with losing weight with diet and exercise regimen she wanted weight loss medications. She was asked to try Qsymia but because of lack of coverage she was given the equivalent doses in generic form She was started on low-dose phentermine every other day initially and Topamax With increasing the dose of phentermine to 15 mg daily and Topamax to 50 mg daily she has better satiety and has been he would've progressively lose weight, now 34 pounds. She is also exercising on treadmill at least 3 times a week and is more motivated  She has not had any formal dietitian consultation which he thinks he is really having healthy meals No side effects from medications currently  Wt Readings from Last 3 Encounters:  06/06/14 150 lb 9.6 oz (68.312 kg)  01/25/14 169 lb (76.658 kg)  12/06/13 184 lb 6.4 oz (83.643 kg)    Problem 2: Thyroid nodule which was biopsied in 04/2013 and was found to be benign. This measured about 3 cm previously on exam Also  had mildly low TSH without high free T4; has not had a nuclear thyroid scan previously Thyroid functions are now normal. No complaints of palpitations or heat intolerance/shakiness  Lab Results  Component Value Date   FREET4 1.12 06/03/2014   FREET4 1.07 08/23/2013   FREET4 1.01 04/01/2013   TSH 0.41 06/03/2014   TSH 0.12* 08/23/2013   TSH 0.31* 04/01/2013       Medication List       This list is accurate as of: 06/06/14  4:22 PM.  Always use your most recent med list.               calcium carbonate 600 MG Tabs tablet  Commonly known as:  OS-CAL  Take 600 mg by mouth 2 (two) times daily with a meal.     multivitamin tablet  Take 1 tablet by mouth daily.     phentermine 15 MG capsule  Take 1 capsule (15 mg total) by mouth daily.     topiramate 50 MG tablet  Commonly  known as:  TOPAMAX  Take 1 tablet (50 mg total) by mouth 2 (two) times daily.        Allergies:  Allergies  Allergen Reactions  . Latex Swelling    Past Medical History  Diagnosis Date  . Anemia   . Herpes   . Dysmenorrhea   . Thyroid nodule     bx 04-2013    Past Surgical History  Procedure Laterality Date  . Novasure ablation    . Breast surgery      biospy  . Foot surgery Bilateral     Family History  Problem Relation Age of Onset  . Hypertension Mother     Jerilynn Mages- brother   . Leukemia Mother     deceased  . Diabetes Brother   . Breast cancer Maternal Aunt     aunt-cousin  . Hypothyroidism Sister     had thyroid surgery  . Colon cancer Neg Hx   . CAD Neg Hx     Social History:  reports that she has never smoked. She has never used smokeless tobacco. She reports that she does not drink alcohol or use illicit drugs.  Review of Systems   No history of hypertension or diabetes  LABS:  Appointment on 06/03/2014  Component Date Value Ref Range Status  . T3, Free 06/03/2014 2.9  2.3 - 4.2 pg/mL Final  . Sodium 06/03/2014 142  135 - 145 mEq/L Final  . Potassium 06/03/2014 3.9  3.5 - 5.1 mEq/L Final  . Chloride 06/03/2014 111  96 - 112 mEq/L Final  . CO2 06/03/2014 22  19 - 32 mEq/L Final  . Glucose, Bld 06/03/2014 77  70 - 99 mg/dL Final  . BUN 06/03/2014 10  6 - 23 mg/dL Final  . Creatinine, Ser 06/03/2014 0.7  0.4 - 1.2 mg/dL Final  . Calcium 06/03/2014 10.8* 8.4 - 10.5 mg/dL Final  . GFR 06/03/2014 105.76  >60.00 mL/min Final  . TSH 06/03/2014 0.41  0.35 - 4.50 uIU/mL Final  . Free T4 06/03/2014 1.12  0.60 - 1.60 ng/dL Final    EXAM:  BP 134/82  Pulse 107  Temp(Src) 97.6 F (36.4 C)  Resp 14  Ht 5\' 6"  (1.676 m)  Wt 150 lb 9.6 oz (68.312 kg)  BMI 24.32 kg/m2  SpO2 98%  Repeat pulse 96  Thyroid nodule is about 2.5-3 cm, smooth and firm, palpable in the isthmus area No local lymphadenopathy  Assessment/Plan:   Obesity with baseline BMI 33:   She is on combination of low-dose phentermine and Topamax daily  which is continuing to significantly provide her weight loss Also she has  been exercising recently She has not had any side effects subjectively She appears to have resting tachycardia today even after seen in the office for sometime Discussed that phentermine may not be safe long-term and would like to reduce the dose to every other day especially since she has achieved significant weight loss  THYROID nodule: Benign as shown on previous biopsy Stable on exam and her TSH is back to normal   Horace Lukas 06/06/2014, 4:22 PM   Addendum: Calcium level was high, will need to repeat on next visit with PTH and vitamin D

## 2014-06-06 NOTE — Telephone Encounter (Signed)
Patient would like to know the correct instructions on how to take medsTomapax 50 mg and she need a new prescription. Please advise  [facility] [address]

## 2014-06-09 ENCOUNTER — Other Ambulatory Visit: Payer: Self-pay | Admitting: *Deleted

## 2014-06-09 MED ORDER — TOPIRAMATE 50 MG PO TABS: ORAL_TABLET | ORAL | Status: DC

## 2014-06-09 NOTE — Addendum Note (Signed)
Addended by: [name] on: 06/09/2014 02:55 PM   Modules accepted: Orders

## 2014-06-15 ENCOUNTER — Other Ambulatory Visit: Payer: Self-pay | Admitting: Endocrinology

## 2014-08-18 ENCOUNTER — Encounter: Payer: Self-pay | Admitting: Endocrinology

## 2014-10-06 ENCOUNTER — Ambulatory Visit (INDEPENDENT_AMBULATORY_CARE_PROVIDER_SITE_OTHER): Payer: 59 | Admitting: Endocrinology

## 2014-10-06 ENCOUNTER — Encounter: Payer: Self-pay | Admitting: Endocrinology

## 2014-10-06 ENCOUNTER — Other Ambulatory Visit: Payer: Self-pay | Admitting: *Deleted

## 2014-10-06 VITALS — BP 124/72 | HR 84 | Temp 98.1°F | Resp 14 | Ht 66.0 in | Wt 147.2 lb

## 2014-10-06 DIAGNOSIS — E041 Nontoxic single thyroid nodule: Secondary | ICD-10-CM

## 2014-10-06 MED ORDER — TOPIRAMATE 50 MG PO TABS: ORAL_TABLET | ORAL | Status: DC

## 2014-10-06 MED ORDER — PHENTERMINE HCL 15 MG PO CAPS: 15.0000 mg | ORAL_CAPSULE | Freq: Every day | ORAL | Status: DC

## 2014-10-06 NOTE — Patient Instructions (Addendum)
Take Topamax daily  Walk every 2 days

## 2014-10-06 NOTE — Progress Notes (Signed)
Kathleen Burton is a 53 y.o. female.    Chief complaint: Followup   History of Present Illness:   Problem 1: Weight management  Because of a weight gain of about 15 pounds and difficulty with losing weight with diet and exercise regimen she wanted weight loss medications. She was asked to try Qsymia but because of lack of coverage she was given the equivalent doses in generic form With increasing the dose of phentermine to 15 mg daily and Topamax to 50 mg daily she had improved satiety and has had progressive weight loss She also started exercising on treadmill at 2-3 times a week and was more motivated On her last visit in 8/15 because of resting tachycardia she was told to take phentermine every other day and she is doing this it however she is also taking the Topamax every other day Still has managed to lose another 3 pounds  She has not had any formal dietitian consultation; she is keeping portions down No side effects from medications again  Wt Readings from Last 3 Encounters:  10/06/14 147 lb 3.2 oz (66.769 kg)  06/06/14 150 lb 9.6 oz (68.312 kg)  01/25/14 169 lb (76.658 kg)    Problem 2: Thyroid nodule which was biopsied in 04/2013 and was found to be benign. This measured about 3 cm previously on exam Also  had mildly low TSH without high free T4; has not had a nuclear thyroid scan previously TSH was improved on 06/03/14 No complaints of fatigue   Lab Results  Component Value Date   FREET4 1.12 06/03/2014   FREET4 1.07 08/23/2013   FREET4 1.01 04/01/2013   TSH 0.41 06/03/2014   TSH 0.12* 08/23/2013   TSH 0.31* 04/01/2013       Medication List       This list is accurate as of: 10/06/14  5:02 PM.  Always use your most recent med list.               calcium carbonate 600 MG Tabs tablet  Commonly known as:  OS-CAL  Take 600 mg by mouth 2 (two) times daily with a meal.     multivitamin tablet  Take 1 tablet by mouth daily.     phentermine 15 MG capsule   Take 1 capsule (15 mg total) by mouth daily.     topiramate 50 MG tablet  Commonly known as:  TOPAMAX  TAKE 1 TABLET (50 MG TOTAL) BY MOUTH 2 (TWO) TIMES DAILY.        Allergies:  Allergies  Allergen Reactions  . Latex Swelling    Past Medical History  Diagnosis Date  . Anemia   . Herpes   . Dysmenorrhea   . Thyroid nodule     bx 04-2013    Past Surgical History  Procedure Laterality Date  . Novasure ablation    . Breast surgery      biospy  . Foot surgery Bilateral     Family History  Problem Relation Age of Onset  . Hypertension Mother     Jerilynn Mages- brother   . Leukemia Mother     deceased  . Diabetes Brother   . Breast cancer Maternal Aunt     aunt-cousin  . Hypothyroidism Sister     had thyroid surgery  . Colon cancer Neg Hx   . CAD Neg Hx     Social History:  reports that she has never smoked. She has never used smokeless tobacco. She reports that she does  not drink alcohol or use illicit drugs.  Review of Systems   No history of hypertension or diabetes  LABS:  No visits with results within 1 Week(s) from this visit. Latest known visit with results is:  Appointment on 06/03/2014  Component Date Value Ref Range Status  . T3, Free 06/03/2014 2.9  2.3 - 4.2 pg/mL Final  . Sodium 06/03/2014 142  135 - 145 mEq/L Final  . Potassium 06/03/2014 3.9  3.5 - 5.1 mEq/L Final  . Chloride 06/03/2014 111  96 - 112 mEq/L Final  . CO2 06/03/2014 22  19 - 32 mEq/L Final  . Glucose, Bld 06/03/2014 77  70 - 99 mg/dL Final  . BUN 06/03/2014 10  6 - 23 mg/dL Final  . Creatinine, Ser 06/03/2014 0.7  0.4 - 1.2 mg/dL Final  . Calcium 06/03/2014 10.8* 8.4 - 10.5 mg/dL Final  . GFR 06/03/2014 105.76  >60.00 mL/min Final  . TSH 06/03/2014 0.41  0.35 - 4.50 uIU/mL Final  . Free T4 06/03/2014 1.12  0.60 - 1.60 ng/dL Final    EXAM:  BP 124/72 mmHg  Pulse 84  Temp(Src) 98.1 F (36.7 C)  Resp 14  Ht 5\' 6"  (1.676 m)  Wt 147 lb 3.2 oz (66.769 kg)  BMI 23.77 kg/m2   SpO2 97%    Thyroid nodule is about 2.5-3 cm, smooth and firm, palpable in the isthmus area.  No thyroid enlargement laterally No neck lymphadenopathy  Assessment/Plan:   Obesity with baseline BMI 33:  She is on combination of low-dose phentermine and Topamax daily  which is continuing to help her with lose weight and she appears to be in the maintenance phase now Also she has  been exercising although not as much as before She has not had any side effects subjectively and her pulse is not as fast with taking phentermine every other day For now she can continue the same doses except she can take Topamax every day which she is tolerating  THYROID nodule: Clinically unchanged in size on exam She has a benign nodule proven by biopsy  Calcium level was high previously, will need to repeat on next visit with PTH and vitamin D  Kathleen Burton 10/06/2014, 5:02 PM

## 2015-01-22 ENCOUNTER — Other Ambulatory Visit (HOSPITAL_COMMUNITY): Payer: Self-pay | Admitting: Obstetrics and Gynecology

## 2015-01-22 DIAGNOSIS — Z1231 Encounter for screening mammogram for malignant neoplasm of breast: Secondary | ICD-10-CM

## 2015-02-02 ENCOUNTER — Ambulatory Visit (INDEPENDENT_AMBULATORY_CARE_PROVIDER_SITE_OTHER): Payer: 59 | Admitting: Family

## 2015-02-02 ENCOUNTER — Other Ambulatory Visit: Payer: 59

## 2015-02-02 ENCOUNTER — Encounter: Payer: Self-pay | Admitting: Family

## 2015-02-02 VITALS — BP 128/78 | HR 79 | Temp 97.8°F | Resp 18 | Ht 66.0 in | Wt 142.0 lb

## 2015-02-02 DIAGNOSIS — R350 Frequency of micturition: Secondary | ICD-10-CM

## 2015-02-02 LAB — POCT URINALYSIS DIPSTICK
Bilirubin, UA: NEGATIVE
Glucose, UA: NEGATIVE
Ketones, UA: NEGATIVE
Nitrite, UA: NEGATIVE
SPEC GRAV UA: 1.02
UROBILINOGEN UA: NEGATIVE
pH, UA: 6

## 2015-02-02 MED ORDER — SULFAMETHOXAZOLE-TRIMETHOPRIM 800-160 MG PO TABS: 1.0000 | ORAL_TABLET | Freq: Two times a day (BID) | ORAL | Status: DC

## 2015-02-02 NOTE — Patient Instructions (Signed)
Thank you for choosing [facility].  Summary/Instructions:  Your prescription(s) have been submitted to your pharmacy or been printed and provided for you. Please take as directed and contact our office if you believe you are having problem(s) with the medication(s) or have any questions.  If your symptoms worsen or fail to improve, please contact our office for further instruction, or in case of emergency go directly to the emergency room at the closest medical facility.   Urinary Tract Infection Urinary tract infections (UTIs) can develop anywhere along your urinary tract. Your urinary tract is your body's drainage system for removing wastes and extra water. Your urinary tract includes two kidneys, two ureters, a bladder, and a urethra. Your kidneys are a pair of bean-shaped organs. Each kidney is about the size of your fist. They are located below your ribs, one on each side of your spine. CAUSES Infections are caused by microbes, which are microscopic organisms, including fungi, viruses, and bacteria. These organisms are so small that they can only be seen through a microscope. Bacteria are the microbes that most commonly cause UTIs. SYMPTOMS  Symptoms of UTIs may vary by age and gender of the patient and by the location of the infection. Symptoms in young women typically include a frequent and intense urge to urinate and a painful, burning feeling in the bladder or urethra during urination. Older women and men are more likely to be tired, shaky, and weak and have muscle aches and abdominal pain. A fever may mean the infection is in your kidneys. Other symptoms of a kidney infection include pain in your back or sides below the ribs, nausea, and vomiting. DIAGNOSIS To diagnose a UTI, your caregiver will ask you about your symptoms. Your caregiver also will ask to provide a urine sample. The urine sample will be tested for bacteria and white blood cells. White blood cells are made by your  body to help fight infection. TREATMENT  Typically, UTIs can be treated with medication. Because most UTIs are caused by a bacterial infection, they usually can be treated with the use of antibiotics. The choice of antibiotic and length of treatment depend on your symptoms and the type of bacteria causing your infection. HOME CARE INSTRUCTIONS  If you were prescribed antibiotics, take them exactly as your caregiver instructs you. Finish the medication even if you feel better after you have only taken some of the medication.  Drink enough water and fluids to keep your urine clear or pale yellow.  Avoid caffeine, tea, and carbonated beverages. They tend to irritate your bladder.  Empty your bladder often. Avoid holding urine for long periods of time.  Empty your bladder before and after sexual intercourse.  After a bowel movement, women should cleanse from front to back. Use each tissue only once. SEEK MEDICAL CARE IF:   You have back pain.  You develop a fever.  Your symptoms do not begin to resolve within 3 days. SEEK IMMEDIATE MEDICAL CARE IF:   You have severe back pain or lower abdominal pain.  You develop chills.  You have nausea or vomiting.  You have continued burning or discomfort with urination. MAKE SURE YOU:   Understand these instructions.  Will watch your condition.  Will get help right away if you are not doing well or get worse. Document Released: 07/13/2005 Document Revised: 04/03/2012 Document Reviewed: 11/11/2011 Exit[employer] Patient Information 2015 [employer]. This information is not intended to replace advice given to you by your health care provider.   Make sure you discuss any questions you have with your health care provider.   

## 2015-02-02 NOTE — Progress Notes (Signed)
   Subjective:    Patient ID: Kathleen Burton, female    DOB: 11/16/1960, 54 y.o.   MRN: [id number]  Chief Complaint  Patient presents with  . Flank Pain    x4 days flank pain that she states has hurt really bad and dark urine that looked that it had blood in it,    HPI:  Kathleen Burton is a 54 y.o. female who presents today for an acute visit.   This is a new problem. Associated symptoms of urgency and frequency combined with flank pain, dark urine, and hematuria has been going on for approximately 4 days. Indicates that she has been taking Aleve which has not helped a whole lot. Indicates that she drinks a lot of water.    Allergies  Allergen Reactions  . Latex Swelling    Current Outpatient Prescriptions on File Prior to Visit  Medication Sig Dispense Refill  . calcium carbonate (OS-CAL) 600 MG TABS Take 600 mg by mouth 2 (two) times daily with a meal.    . Multiple Vitamin (MULTIVITAMIN) tablet Take 1 tablet by mouth daily.    . phentermine 15 MG capsule Take 1 capsule (15 mg total) by mouth daily. 30 capsule 3  . topiramate (TOPAMAX) 50 MG tablet TAKE 1 TABLET (50 MG TOTAL) BY MOUTH 2 (TWO) TIMES DAILY. 60 tablet 3   No current facility-administered medications on file prior to visit.    Review of Systems  Constitutional: Negative for fever and chills.  Genitourinary: Positive for dysuria, urgency, frequency and flank pain.      Objective:    BP 128/78 mmHg  Pulse 79  Temp(Src) 97.8 F (36.6 C) (Oral)  Resp 18  Ht 5\' 6"  (1.676 m)  Wt 142 lb (64.411 kg)  BMI 22.93 kg/m2  SpO2 97% Nursing note and vital signs reviewed.  Physical Exam  Constitutional: She is oriented to person, place, and time. She appears well-developed and well-nourished. No distress.  Cardiovascular: Normal rate, regular rhythm, normal heart sounds and intact distal pulses.   Pulmonary/Chest: Effort normal and breath sounds normal.  Abdominal: There is CVA tenderness.  Neurological: She  is alert and oriented to person, place, and time.  Skin: Skin is warm and dry.  Psychiatric: She has a normal mood and affect. Her behavior is normal. Judgment and thought content normal.       Assessment & Plan:

## 2015-02-02 NOTE — Assessment & Plan Note (Signed)
Symptoms and exam consistent with cystitis. In office urinalysis shows positive for leukocytes and and hematuria while negative for nitrites. Start Bactrim. Patient instructed to continue to drink plenty of water. Urine will be sent for culture. Follow up if symptoms worsen or fail to improve.

## 2015-02-02 NOTE — Progress Notes (Signed)
Pre visit review using our clinic review tool, if applicable. No additional management support is needed unless otherwise documented below in the visit note. 

## 2015-02-04 ENCOUNTER — Telehealth: Payer: Self-pay | Admitting: Family

## 2015-02-04 NOTE — Telephone Encounter (Signed)
Please inform patient that her urine culture did show some bacteria but nothing that crossed the threshold for a significant infection. Therefore please complete the antibiotic and follow up with Dr. Larose Kells if she continues to have symptoms.

## 2015-02-04 NOTE — Telephone Encounter (Signed)
Pt aware of results 

## 2015-02-05 ENCOUNTER — Telehealth: Payer: Self-pay | Admitting: Family

## 2015-02-05 LAB — URINE CULTURE: Colony Count: 50000

## 2015-02-09 ENCOUNTER — Other Ambulatory Visit: Payer: Self-pay | Admitting: *Deleted

## 2015-02-09 MED ORDER — PHENTERMINE HCL 15 MG PO CAPS: 15.0000 mg | ORAL_CAPSULE | Freq: Every day | ORAL | Status: DC

## 2015-02-09 MED ORDER — TOPIRAMATE 50 MG PO TABS: ORAL_TABLET | ORAL | Status: DC

## 2015-02-16 ENCOUNTER — Ambulatory Visit (HOSPITAL_COMMUNITY)
Admission: RE | Admit: 2015-02-16 | Discharge: 2015-02-16 | Disposition: A | Discharge status: Home / Self Care | Payer: 59 | Source: Ambulatory Visit | Attending: Obstetrics and Gynecology | Admitting: Obstetrics and Gynecology

## 2015-02-16 DIAGNOSIS — Z1231 Encounter for screening mammogram for malignant neoplasm of breast: Secondary | ICD-10-CM | POA: Insufficient documentation

## 2015-03-13 NOTE — Telephone Encounter (Signed)
Close encounter 

## 2015-04-07 ENCOUNTER — Other Ambulatory Visit: Payer: 59

## 2015-04-08 ENCOUNTER — Other Ambulatory Visit (INDEPENDENT_AMBULATORY_CARE_PROVIDER_SITE_OTHER): Payer: 59

## 2015-04-08 DIAGNOSIS — E041 Nontoxic single thyroid nodule: Secondary | ICD-10-CM

## 2015-04-08 LAB — BASIC METABOLIC PANEL
BUN: 13 mg/dL (ref 6–23)
CALCIUM: 9.9 mg/dL (ref 8.4–10.5)
CO2: 17 mEq/L — ABNORMAL LOW (ref 19–32)
Chloride: 110 mEq/L (ref 96–112)
Creatinine, Ser: 0.79 mg/dL (ref 0.40–1.20)
GFR: 97.76 mL/min (ref >60.00)
GLUCOSE: 92 mg/dL (ref 70–99)
POTASSIUM: 3.9 meq/L (ref 3.5–5.1)
SODIUM: 136 meq/L (ref 135–145)

## 2015-04-08 LAB — TSH: TSH: 0.3 u[IU]/mL — AB (ref 0.35–4.50)

## 2015-04-08 LAB — T4, FREE: Free T4: 0.96 ng/dL (ref 0.60–1.60)

## 2015-04-10 ENCOUNTER — Encounter: Payer: Self-pay | Admitting: Endocrinology

## 2015-04-10 ENCOUNTER — Ambulatory Visit (INDEPENDENT_AMBULATORY_CARE_PROVIDER_SITE_OTHER): Payer: 59 | Admitting: Endocrinology

## 2015-04-10 VITALS — BP 122/76 | HR 92 | Temp 98.5°F | Resp 15 | Ht 66.0 in | Wt 150.0 lb

## 2015-04-10 DIAGNOSIS — E041 Nontoxic single thyroid nodule: Secondary | ICD-10-CM

## 2015-04-10 NOTE — Progress Notes (Signed)
Kathleen Burton is a 54 y.o. female.    Chief complaint: Followup of thyroid and weight loss  History of Present Illness:   Problem 1: Weight management  Because of a weight gain of about 15 pounds and difficulty with losing weight with diet and exercise regimen she wanted weight loss medications. She was asked to try Qsymia but because of lack of coverage she was given the equivalent doses in generic form With increasing the dose of phentermine to 15 mg daily and Topamax to 50 mg daily she had improved satiety and has had progressive weight loss  On her  visit in 8/15 because of resting tachycardia she was told to take phentermine every other day and she is doing this  Currently taking Topamax daily She has not had any formal dietitian consultation; she is keeping portions down However recently she has not been consistent with diet although time because of stress and also has not exercised Previously was walking on a treadmill Her weight has gone up No side effects from medications again  Wt Readings from Last 3 Encounters:  04/10/15 150 lb (68.04 kg)  02/02/15 142 lb (64.411 kg)  10/06/14 147 lb 3.2 oz (66.769 kg)    Problem 2: Thyroid nodule which was biopsied in 04/2013 and was found to be benign. This measured about 3 cm previously on exam Also  had mildly low TSH without high free T4; has not had a nuclear thyroid scan previously TSH was improved on 06/03/14 but is now borderline low without increase in free T4 She feels well overall and has no local discomfort   Lab Results  Component Value Date   FREET4 0.96 04/08/2015   FREET4 1.12 06/03/2014   FREET4 1.07 08/23/2013   TSH 0.30* 04/08/2015   TSH 0.41 06/03/2014   TSH 0.12* 08/23/2013       Medication List       This list is accurate as of: 04/10/15 11:59 PM.  Always use your most recent med list.               calcium carbonate 600 MG Tabs tablet  Commonly known as:  OS-CAL  Take 600 mg by mouth 2 (two)  times daily with a meal.     multivitamin tablet  Take 1 tablet by mouth daily.     phentermine 15 MG capsule  Take 1 capsule (15 mg total) by mouth daily.     sulfamethoxazole-trimethoprim 800-160 MG per tablet  Commonly known as:  BACTRIM DS,SEPTRA DS  Take 1 tablet by mouth 2 (two) times daily.     topiramate 50 MG tablet  Commonly known as:  TOPAMAX  TAKE 1 TABLET (50 MG TOTAL) BY MOUTH 2 (TWO) TIMES DAILY.        Allergies:  Allergies  Allergen Reactions  . Latex Swelling    Past Medical History  Diagnosis Date  . Anemia   . Herpes   . Dysmenorrhea   . Thyroid nodule     bx 04-2013    Past Surgical History  Procedure Laterality Date  . Novasure ablation    . Breast surgery      biospy  . Foot surgery Bilateral     Family History  Problem Relation Age of Onset  . Hypertension Mother     Jerilynn Mages- brother   . Leukemia Mother     deceased  . Diabetes Brother   . Breast cancer Maternal Aunt     aunt-cousin  . Hypothyroidism  Sister     had thyroid surgery  . Colon cancer Neg Hx   . CAD Neg Hx     Social History:  reports that she has never smoked. She has never used smokeless tobacco. She reports that she does not drink alcohol or use illicit drugs.  Review of Systems   No history of hypertension or diabetes  LABS:  Lab on 04/08/2015  Component Date Value Ref Range Status  . Sodium 04/08/2015 136  135 - 145 mEq/L Final  . Potassium 04/08/2015 3.9  3.5 - 5.1 mEq/L Final  . Chloride 04/08/2015 110  96 - 112 mEq/L Final  . CO2 04/08/2015 17* 19 - 32 mEq/L Final  . Glucose, Bld 04/08/2015 92  70 - 99 mg/dL Final  . BUN 04/08/2015 13  6 - 23 mg/dL Final  . Creatinine, Ser 04/08/2015 0.79  0.40 - 1.20 mg/dL Final  . Calcium 04/08/2015 9.9  8.4 - 10.5 mg/dL Final  . GFR 04/08/2015 97.76  >60.00 mL/min Final  . PTH 04/08/2015 31  15 - 65 pg/mL Final  . Vitamin D 1, 25 (OH)2 Total 04/08/2015 WILL FOLLOW   Preliminary  . Vitamin D2 1, 25 (OH)2 04/08/2015  WILL FOLLOW   Preliminary  . Vitamin D3 1, 25 (OH)2 04/08/2015 WILL FOLLOW   Preliminary  . TSH 04/08/2015 0.30* 0.35 - 4.50 uIU/mL Final  . Free T4 04/08/2015 0.96  0.60 - 1.60 ng/dL Final    EXAM:  BP 122/76 mmHg  Pulse 92  Temp(Src) 98.5 F (36.9 C) (Oral)  Resp 15  Ht 5\' 6"  (1.676 m)  Wt 150 lb (68.04 kg)  BMI 24.22 kg/m2  SpO2 97%  Repeat pulse 76  Thyroid nodule is about 2.5 cm, smooth and firm, palpable in the left isthmus area.  Right lobe just palpable, smooth No neck lymphadenopathy  Assessment/Plan:   Obesity with baseline BMI 33:  She is on combination of low-dose phentermine and Topamax daily  which has helped her with weight loss She has however gained a little weight recently because of lack of exercise and may not have been as consistent with diet because of increased stress and long work hours and inadequate sleep Her pulse rate is not as fast with taking phentermine every other day She is tolerating Topamax daily and will continue  THYROID nodule: Clinically unchanged in size on exam She has a benign nodule proven by biopsy  TSH level is 0.3 without change in free T4, will continue to monitor  Calcium level was high previously, now normal along with PTH level.  May have been lab error previously  [facility] 04/11/2015, 4:04 PM

## 2015-04-16 LAB — VITAMIN D 1,25 DIHYDROXY

## 2015-04-16 LAB — PARATHYROID HORMONE, INTACT (NO CA): PTH: 31 pg/mL (ref 15–65)

## 2015-06-29 ENCOUNTER — Telehealth: Payer: Self-pay | Admitting: Endocrinology

## 2015-06-29 ENCOUNTER — Other Ambulatory Visit: Payer: Self-pay | Admitting: *Deleted

## 2015-06-29 MED ORDER — PHENTERMINE HCL 15 MG PO CAPS: 15.0000 mg | ORAL_CAPSULE | Freq: Every day | ORAL | Status: DC

## 2015-06-29 MED ORDER — TOPIRAMATE 50 MG PO TABS: ORAL_TABLET | ORAL | Status: DC

## 2015-06-29 NOTE — Telephone Encounter (Signed)
Kathleen Burton, This is your patient, Thanks! 

## 2015-06-29 NOTE — Telephone Encounter (Signed)
Pt calling for new rx for centronim and topamate  She will pick up the centronim rx  The [facility] [phone]

## 2015-07-04 ENCOUNTER — Emergency Department (HOSPITAL_COMMUNITY)
Admission: EM | Admit: 2015-07-04 | Discharge: 2015-07-04 | Disposition: A | Discharge status: Home / Self Care | Payer: 59 | Source: Home / Self Care | Attending: Family Medicine | Admitting: Family Medicine

## 2015-07-04 ENCOUNTER — Encounter (HOSPITAL_COMMUNITY): Payer: Self-pay | Admitting: *Deleted

## 2015-07-04 DIAGNOSIS — K529 Noninfective gastroenteritis and colitis, unspecified: Secondary | ICD-10-CM

## 2015-07-04 LAB — POCT I-STAT, CHEM 8
BUN: 10 mg/dL (ref 6–20)
Calcium, Ion: 1.28 mmol/L — ABNORMAL HIGH (ref 1.12–1.23)
Chloride: 109 mmol/L (ref 101–111)
Creatinine, Ser: 0.7 mg/dL (ref 0.44–1.00)
GLUCOSE: 80 mg/dL (ref 65–99)
HCT: 50 % — ABNORMAL HIGH (ref 36.0–46.0)
Hemoglobin: 17 g/dL — ABNORMAL HIGH (ref 12.0–15.0)
POTASSIUM: 3.8 mmol/L (ref 3.5–5.1)
SODIUM: 144 mmol/L (ref 135–145)
TCO2: 19 mmol/L (ref 0–100)

## 2015-07-04 MED ORDER — SODIUM CHLORIDE 0.9 % IV BOLUS (SEPSIS)
1000.0000 mL | Freq: Once | INTRAVENOUS | Status: AC
  Administered 2015-07-04: 1000 mL via INTRAVENOUS

## 2015-07-04 MED ORDER — DIPHENOXYLATE-ATROPINE 2.5-0.025 MG PO TABS: 2.0000 | ORAL_TABLET | Freq: Four times a day (QID) | ORAL | Status: DC | PRN

## 2015-07-04 NOTE — ED Notes (Signed)
Pt reports  Diarrhea         X  3  Days       Had cramping        And  Nausea  As  Well               Pt states   She  Feels  Somewhat  Weak  As  Well

## 2015-07-04 NOTE — ED Provider Notes (Signed)
CSN: [id number]     Arrival date & time 07/04/15  1847 History   First MD Initiated Contact with Patient 07/04/15 1922     Chief Complaint  Patient presents with  . Diarrhea   (Consider location/radiation/quality/duration/timing/severity/associated sxs/prior Treatment) Patient is a 54 y.o. female presenting with diarrhea. The history is provided by the patient.  Diarrhea Quality:  Watery Severity:  Moderate Onset quality:  Sudden Number of episodes:  6 times today Duration:  5 days Progression:  Unchanged (assoc n/v on tues but resolved. diarrhea continues without blood.) Relieved by:  Nothing Ineffective treatments:  Anti-motility medications Associated symptoms: abdominal pain and vomiting   Associated symptoms: no chills and no fever   Risk factors: suspect food intake   Risk factors: no recent antibiotic use, no sick contacts and no travel to endemic areas     Past Medical History  Diagnosis Date  . Anemia   . Herpes   . Dysmenorrhea   . Thyroid nodule     bx 04-2013   Past Surgical History  Procedure Laterality Date  . Novasure ablation    . Breast surgery      biospy  . Foot surgery Bilateral    Family History  Problem Relation Age of Onset  . Hypertension Mother     Jerilynn Mages- brother   . Leukemia Mother     deceased  . Diabetes Brother   . Breast cancer Maternal Aunt     aunt-cousin  . Hypothyroidism Sister     had thyroid surgery  . Colon cancer Neg Hx   . CAD Neg Hx    Social History  Substance Use Topics  . Smoking status: Never Smoker   . Smokeless tobacco: Never Used  . Alcohol Use: No   OB History    Gravida Para Term Preterm AB TAB SAB Ectopic Multiple Living   1 1        1      Review of Systems  Constitutional: Negative for fever and chills.  HENT: Negative.   Gastrointestinal: Positive for nausea, vomiting, abdominal pain and diarrhea. Negative for blood in stool.  Genitourinary: Negative.   All other systems reviewed and are  negative.   Allergies  Latex  Home Medications   Prior to Admission medications   Medication Sig Start Date End Date Taking? Authorizing Provider  calcium carbonate (OS-CAL) 600 MG TABS Take 600 mg by mouth 2 (two) times daily with a meal.    Historical Provider, MD  diphenoxylate-atropine (LOMOTIL) 2.5-0.025 MG per tablet Take 2 tablets by mouth 4 (four) times daily as needed for diarrhea or loose stools. 07/04/15   Billy Fischer, MD  Multiple Vitamin (MULTIVITAMIN) tablet Take 1 tablet by mouth daily.    Historical Provider, MD  phentermine 15 MG capsule Take 1 capsule (15 mg total) by mouth daily. 06/29/15   [name], MD  sulfamethoxazole-trimethoprim (BACTRIM DS,SEPTRA DS) 800-160 MG per tablet Take 1 tablet by mouth 2 (two) times daily. 02/02/15   Golden Circle, FNP  topiramate (TOPAMAX) 50 MG tablet TAKE 1 TABLET (50 MG TOTAL) BY MOUTH 2 (TWO) TIMES DAILY. 06/29/15   [name], MD   Meds Ordered and Administered this Visit   Medications  sodium chloride 0.9 % bolus 1,000 mL (1,000 mLs Intravenous Given 07/04/15 1944)    BP 120/76 mmHg  Pulse 92  Temp(Src) 97.3 F (36.3 C) (Oral)  Resp 16  SpO2 100% No data found.   Physical Exam  Constitutional: She  is oriented to person, place, and time. She appears well-developed and well-nourished. No distress.  HENT:  Head: Normocephalic.  Mouth/Throat: Oropharynx is clear and moist.  Neck: Normal range of motion. Neck supple.  Cardiovascular: Normal rate and normal heart sounds.   Pulmonary/Chest: Effort normal and breath sounds normal.  Abdominal: Soft. Bowel sounds are normal. She exhibits no distension and no mass. There is no tenderness. There is no rebound and no guarding.  Musculoskeletal: She exhibits no edema.  Lymphadenopathy:    She has no cervical adenopathy.  Neurological: She is alert and oriented to person, place, and time.  Skin: Skin is warm and dry.  Nursing note and vitals reviewed.   ED Course   Procedures (including critical care time)  Labs Review Labs Reviewed  POCT I-STAT, CHEM 8 - Abnormal; Notable for the following:    Calcium, Ion 1.28 (*)    Hemoglobin 17.0 (*)    HCT 50.0 (*)    All other components within normal limits   i-stat wnl. Imaging Review No results found.   Visual Acuity Review  Right Eye Distance:   Left Eye Distance:   Bilateral Distance:    Right Eye Near:   Left Eye Near:    Bilateral Near:         MDM   1. Gastroenteritis, acute    Sx improved with ivf.    Billy Fischer, MD 07/08/15 [phone]

## 2015-07-04 NOTE — Discharge Instructions (Signed)
Clear liquid until monday as tolerated, advance on monday as improved, use medicine as needed, use probiotic Align twice a day. return or see your doctor if any problems.

## 2015-10-13 ENCOUNTER — Other Ambulatory Visit: Payer: 59

## 2015-10-14 ENCOUNTER — Other Ambulatory Visit: Payer: 59

## 2015-10-14 ENCOUNTER — Other Ambulatory Visit (INDEPENDENT_AMBULATORY_CARE_PROVIDER_SITE_OTHER): Payer: 59

## 2015-10-14 DIAGNOSIS — E041 Nontoxic single thyroid nodule: Secondary | ICD-10-CM | POA: Diagnosis not present

## 2015-10-14 LAB — BASIC METABOLIC PANEL
BUN: 13 mg/dL (ref 6–23)
CO2: 22 mEq/L (ref 19–32)
CREATININE: 0.62 mg/dL (ref 0.40–1.20)
Calcium: 10 mg/dL (ref 8.4–10.5)
Chloride: 110 mEq/L (ref 96–112)
GFR: 129.05 mL/min (ref >60.00)
GLUCOSE: 75 mg/dL (ref 70–99)
Potassium: 3.4 mEq/L — ABNORMAL LOW (ref 3.5–5.1)
Sodium: 141 mEq/L (ref 135–145)

## 2015-10-14 LAB — TSH: TSH: 0.28 u[IU]/mL — ABNORMAL LOW (ref 0.35–4.50)

## 2015-10-14 LAB — T4, FREE: Free T4: 0.98 ng/dL (ref 0.60–1.60)

## 2015-10-14 LAB — T3, FREE: T3, Free: 3.1 pg/mL (ref 2.3–4.2)

## 2015-10-16 ENCOUNTER — Ambulatory Visit (INDEPENDENT_AMBULATORY_CARE_PROVIDER_SITE_OTHER): Payer: 59 | Admitting: Endocrinology

## 2015-10-16 ENCOUNTER — Encounter: Payer: Self-pay | Admitting: Endocrinology

## 2015-10-16 VITALS — BP 120/68 | HR 97 | Temp 98.3°F | Resp 14 | Ht 66.0 in | Wt 156.6 lb

## 2015-10-16 DIAGNOSIS — E041 Nontoxic single thyroid nodule: Secondary | ICD-10-CM | POA: Diagnosis not present

## 2015-10-16 DIAGNOSIS — E876 Hypokalemia: Secondary | ICD-10-CM

## 2015-10-16 NOTE — Progress Notes (Signed)
Kathleen Burton is a 54 y.o. female.    Chief complaint: Followup of thyroid and weight    History of Present Illness:   Problem 1: Weight management  Because of a weight gain of about 15 pounds and difficulty with losing weight with diet and exercise regimen she wanted weight loss medications. She was asked to try Qsymia but because of lack of coverage she was given the equivalent doses in generic form With increasing the dose of phentermine to 15 mg daily and Topamax to 50 mg daily she had improved satiety and had progressive weight loss  She has not had any formal dietitian consultation; she is generally following healthy diet However recently she has not been consistent with diet over the holidays Despite reminders on her last visit she has not exercised  Previously was walking on a treadmill but she does not use it as it is in her garage Her weight has gone up further   Wt Readings from Last 3 Encounters:  10/16/15 156 lb 9.6 oz (71.033 kg)  04/10/15 150 lb (68.04 kg)  02/02/15 142 lb (64.411 kg)    Problem 2:  Thyroid nodule which was biopsied in 04/2013 and was found to be benign. This measured about 3 cm previously on exam Also  had mildly low TSH without high free T4; has not had a nuclear thyroid scan previously  TSH was improved on 06/03/14 but is again now slightly low without increase in free T4 She feels well overall and has no palpitations or heat intolerance, has some hot flashes  Lab Results  Component Value Date   TSH 0.28* 10/14/2015   TSH 0.30* 04/08/2015   TSH 0.41 06/03/2014   FREET4 0.98 10/14/2015   FREET4 0.96 04/08/2015   FREET4 1.12 06/03/2014        Medication List       This list is accurate as of: 10/16/15 11:59 PM.  Always use your most recent med list.               calcium carbonate 600 MG Tabs tablet  Commonly known as:  OS-CAL  Take 600 mg by mouth 2 (two) times daily with a meal.     multivitamin tablet  Take 1 tablet by  mouth daily.     phentermine 15 MG capsule  Take 1 capsule (15 mg total) by mouth daily.     topiramate 50 MG tablet  Commonly known as:  TOPAMAX  TAKE 1 TABLET (50 MG TOTAL) BY MOUTH 2 (TWO) TIMES DAILY.        Allergies:  Allergies  Allergen Reactions  . Latex Swelling    Past Medical History  Diagnosis Date  . Anemia   . Herpes   . Dysmenorrhea   . Thyroid nodule     bx 04-2013    Past Surgical History  Procedure Laterality Date  . Novasure ablation    . Breast surgery      biospy  . Foot surgery Bilateral     Family History  Problem Relation Age of Onset  . Hypertension Mother     Kathleen Burton- brother   . Leukemia Mother     deceased  . Diabetes Brother   . Breast cancer Maternal Aunt     aunt-cousin  . Hypothyroidism Sister     had thyroid surgery  . Colon cancer Neg Hx   . CAD Neg Hx     Social History:  reports that she has never smoked. She  has never used smokeless tobacco. She reports that she does not drink alcohol or use illicit drugs.  Review of Systems   Appears to have mild hypokalemia  LABS:  Lab on 10/14/2015  Component Date Value Ref Range Status  . TSH 10/14/2015 0.28* 0.35 - 4.50 uIU/mL Final  . Free T4 10/14/2015 0.98  0.60 - 1.60 ng/dL Final  . T3, Free 10/14/2015 3.1  2.3 - 4.2 pg/mL Final  . Sodium 10/14/2015 141  135 - 145 mEq/L Final  . Potassium 10/14/2015 3.4* 3.5 - 5.1 mEq/L Final  . Chloride 10/14/2015 110  96 - 112 mEq/L Final  . CO2 10/14/2015 22  19 - 32 mEq/L Final  . Glucose, Bld 10/14/2015 75  70 - 99 mg/dL Final  . BUN 10/14/2015 13  6 - 23 mg/dL Final  . Creatinine, Ser 10/14/2015 0.62  0.40 - 1.20 mg/dL Final  . Calcium 10/14/2015 10.0  8.4 - 10.5 mg/dL Final  . GFR 10/14/2015 129.05  >60.00 mL/min Final    EXAM:  BP 120/68 mmHg  Pulse 97  Temp(Src) 98.3 F (36.8 C)  Resp 14  Ht 5\' 6"  (1.676 m)  Wt 156 lb 9.6 oz (71.033 kg)  BMI 25.29 kg/m2  SpO2 98%  Repeat pulse 80  Thyroid nodule is about 2.5 cm,  smooth and firm, palpable in the left isthmus area.   No neck lymphadenopathy  Assessment/Plan:   Obesity with baseline BMI 33:  She is on combination of low-dose phentermine and Topamax daily  which has previously helped her with weight loss She has however gained more weight recently because of lack of exercise and may not have been as consistent with diet  She is tolerating Topamax and phentermine daily and will continue  Discussed importance of starting exercise and she will use a home radio or other form of exercise  THYROID nodule: Clinically unchanged in size on exam She has a benign nodule proven by biopsy  TSH level is still relatively low without change in free T4, will continue to monitor, discussed possible treatments if she has hyperthyroidism   Mild hypokalemia: Unclear etiology and will have repeat levels with PCP, needs high potassium diet  Kathleen Burton 10/19/2015, 5:53 PM

## 2015-10-16 NOTE — Patient Instructions (Signed)
Exercise video in winter

## 2015-10-19 NOTE — Progress Notes (Signed)
Quick Note:  Please let patient know that the potassium is slightly low, did not discuss with patient before. Needs to increase dietary intake with foods like bananas, yogurt oranges ______

## 2015-10-21 ENCOUNTER — Telehealth: Payer: Self-pay | Admitting: Endocrinology

## 2015-10-21 ENCOUNTER — Other Ambulatory Visit: Payer: Self-pay | Admitting: *Deleted

## 2015-10-21 MED ORDER — TOPIRAMATE 50 MG PO TABS: ORAL_TABLET | ORAL | Status: DC

## 2015-10-21 MED ORDER — PHENTERMINE HCL 15 MG PO CAPS: 15.0000 mg | ORAL_CAPSULE | Freq: Every day | ORAL | Status: DC

## 2015-10-21 NOTE — Telephone Encounter (Signed)
Patient is returning your call.  

## 2015-11-16 ENCOUNTER — Telehealth: Payer: Self-pay | Admitting: Internal Medicine

## 2015-11-16 NOTE — Telephone Encounter (Signed)
Pt declined scheduling flu shot - she will call back to schedule cpe

## 2015-11-17 NOTE — Telephone Encounter (Signed)
Flu shot declined

## 2016-01-18 ENCOUNTER — Other Ambulatory Visit: Payer: Self-pay

## 2016-01-18 DIAGNOSIS — Z1231 Encounter for screening mammogram for malignant neoplasm of breast: Secondary | ICD-10-CM

## 2016-02-17 ENCOUNTER — Ambulatory Visit
Admission: RE | Admit: 2016-02-17 | Discharge: 2016-02-17 | Disposition: A | Discharge status: Home / Self Care | Payer: 59 | Source: Ambulatory Visit

## 2016-02-17 DIAGNOSIS — Z1231 Encounter for screening mammogram for malignant neoplasm of breast: Secondary | ICD-10-CM

## 2016-02-17 LAB — HM MAMMOGRAPHY

## 2016-03-02 ENCOUNTER — Ambulatory Visit (INDEPENDENT_AMBULATORY_CARE_PROVIDER_SITE_OTHER): Payer: 59 | Admitting: Internal Medicine

## 2016-03-02 ENCOUNTER — Encounter: Payer: Self-pay | Admitting: Internal Medicine

## 2016-03-02 VITALS — BP 118/78 | HR 98 | Temp 98.0°F | Ht 66.0 in | Wt 154.0 lb

## 2016-03-02 DIAGNOSIS — J069 Acute upper respiratory infection, unspecified: Secondary | ICD-10-CM | POA: Diagnosis not present

## 2016-03-02 DIAGNOSIS — Z09 Encounter for follow-up examination after completed treatment for conditions other than malignant neoplasm: Secondary | ICD-10-CM

## 2016-03-02 MED ORDER — AZELASTINE HCL 0.1 % NA SOLN: 2.0000 | Freq: Every evening | NASAL | Status: DC | PRN

## 2016-03-02 NOTE — Progress Notes (Signed)
Pre visit review using our clinic review tool, if applicable. No additional management support is needed unless otherwise documented below in the visit note. 

## 2016-03-02 NOTE — Progress Notes (Signed)
Subjective:    Patient ID: Kathleen Burton, female    DOB: 11/16/1960, 55 y.o.   MRN: [id number]  DOS:  03/02/2016 Type of visit - description : Acute visit Interval history: Symptoms started 3 days ago with sore throat, cough, runny nose. Yesterday the sore throat and cough got worse. She got hoarse. Also developed frontal headache.   Review of Systems No fever but had some chills today + Runny nose, watery. No nausea, vomiting, diarrhea. No myalgias. No sick contacts Denies chest congestion or wheezing. No sputum production per se.  Past Medical History  Diagnosis Date  . Anemia   . Herpes   . Dysmenorrhea   . Thyroid nodule     bx 04-2013    Past Surgical History  Procedure Laterality Date  . Novasure ablation    . Breast surgery      biospy  . Foot surgery Bilateral     Social History   Social History  . Marital Status: Married    Spouse Name: N/A  . Number of Children: 1  . Years of Education: N/A   Occupational History  . dep. 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 Smoking status: Never Smoker   . Smokeless tobacco: Never Used  . Alcohol Use: No  . Drug Use: No  . Sexual Activity: Yes    Birth Control/ Protection: Other-see comments   Other Topics Concern  . Not on file   Social History Narrative   Lives w/ husband, child is at college        Medication List       This list is accurate as of: 03/02/16 11:59 PM.  Always use your most recent med list.               azelastine 0.1 % nasal spray  Commonly known as:  ASTELIN  Place 2 sprays into both nostrils at bedtime as needed for rhinitis. Use in each nostril as directed     calcium carbonate 600 MG Tabs tablet  Commonly known as:  OS-CAL  Take 600 mg by mouth 2 (two) times daily with a meal. Reported on 03/02/2016     multivitamin tablet  Take 1 tablet by mouth daily.     phentermine 15 MG capsule  Take 1 capsule (15 mg total) by  mouth daily.     topiramate 50 MG tablet  Commonly known as:  TOPAMAX  TAKE 1 TABLET (50 MG TOTAL) BY MOUTH 2 (TWO) TIMES DAILY.           Objective:   Physical Exam BP 118/78 mmHg  Pulse 98  Temp(Src) 98 F (36.7 C) (Oral)  Ht 5\' 6"  (1.676 m)  Wt 154 lb (69.854 kg)  BMI 24.87 kg/m2  SpO2 98% General:   Well developed, well nourished . NAD.  HEENT:  Normocephalic . Face symmetric, atraumatic. TMs normal, nose congested, sinuses no TTP. Throat symmetric without redness. + Hoarseness noted Lungs:  CTA B Normal respiratory effort, no intercostal retractions, no accessory muscle use. Heart: RRR,  no murmur.  No pretibial edema bilaterally  Skin: Not pale. Not jaundice Neurologic:  alert & oriented X3.  Speech normal, gait appropriate for age and unassisted Psych--  Cognition and judgment appear intact.  Cooperative with normal attention span and concentration.  Behavior appropriate. No anxious or depressed appearing.      Assessment & Plan:   Assessment Thyroid nodule, BX 2014  benign, sees Dr Dwyane Dee Dysmenorrhea, NovaSure ablation Herpes  Plan: URI: Sx c/w  URI, no evidence of pneumonia or a more serious etiology. Conservative treatment, see instructions

## 2016-03-02 NOTE — Patient Instructions (Signed)
Rest, fluids , tylenol  For cough:  Take Mucinex DM twice a day as needed until better  For nasal congestion: Use OTC Nasocort or Flonase : 2 nasal sprays on each side of the nose in the morning until you feel better Use ASTELIN a prescribed spray : 2 nasal sprays on each side of the nose at night until you feel better   Avoid decongestants such as  Pseudoephedrine or phenylephrine     Call if not gradually better over the next  10 days  Call anytime if the symptoms are severe  

## 2016-03-03 DIAGNOSIS — Z09 Encounter for follow-up examination after completed treatment for conditions other than malignant neoplasm: Secondary | ICD-10-CM | POA: Insufficient documentation

## 2016-03-03 NOTE — Assessment & Plan Note (Signed)
URI: Sx c/w  URI, no evidence of pneumonia or a more serious etiology. Conservative treatment, see instructions

## 2016-03-04 ENCOUNTER — Telehealth: Payer: Self-pay | Admitting: Internal Medicine

## 2016-03-04 MED ORDER — AMOXICILLIN 875 MG PO TABS: 875.0000 mg | ORAL_TABLET | Freq: Two times a day (BID) | ORAL | Status: DC

## 2016-03-04 NOTE — Telephone Encounter (Signed)
please encourage patient to keep trying without antibiotics for a couple of days. If she is not better go ahead and pick up a prescription for amoxicillin I am sending today.

## 2016-03-04 NOTE — Telephone Encounter (Signed)
Letter completed and released to MyChart.

## 2016-03-04 NOTE — Telephone Encounter (Signed)
Spoke w/ Pt, (Pt is very hoarse and kind of difficult to understand). But informed her of Dr. Larose Kells recommendations and that he also sent the [facility] to [facility], informed her I can't e-mail her letter but instructed her on the back of her AVS she received at her visit was an activation code for her MyChart, and I can send it to her there. Pt verbalized understanding, and stated she would begin set up.

## 2016-03-04 NOTE — Telephone Encounter (Signed)
Okay 

## 2016-03-04 NOTE — Telephone Encounter (Signed)
Please advise 

## 2016-03-04 NOTE — Telephone Encounter (Signed)
Can be reached: [phone] Pharmacy: [id number] - [place], [place]  Reason for call: pt is feeling worse than when she was in 03/02/16. Pt is requesting an antibiotic to be sent in. She is also asking about possibly changing the cough medicine that was recommended. It is not helping her. She would also like a note writing her out today and Monday (to be emailed to her at below email address.   Email: [email or web address]

## 2016-03-04 NOTE — Telephone Encounter (Signed)
Also, okay to write work note for today and Monday as requested by Pt?

## 2016-04-08 ENCOUNTER — Other Ambulatory Visit (INDEPENDENT_AMBULATORY_CARE_PROVIDER_SITE_OTHER): Payer: 59

## 2016-04-08 DIAGNOSIS — E876 Hypokalemia: Secondary | ICD-10-CM

## 2016-04-08 DIAGNOSIS — E041 Nontoxic single thyroid nodule: Secondary | ICD-10-CM | POA: Diagnosis not present

## 2016-04-08 LAB — COMPREHENSIVE METABOLIC PANEL
ALT: 14 U/L (ref 0–35)
AST: 15 U/L (ref 0–37)
Albumin: 4.2 g/dL (ref 3.5–5.2)
Alkaline Phosphatase: 88 U/L (ref 39–117)
BILIRUBIN TOTAL: 0.4 mg/dL (ref 0.2–1.2)
BUN: 9 mg/dL (ref 6–23)
CO2: 24 mEq/L (ref 19–32)
Calcium: 10 mg/dL (ref 8.4–10.5)
Chloride: 109 mEq/L (ref 96–112)
Creatinine, Ser: 0.63 mg/dL (ref 0.40–1.20)
GFR: 126.46 mL/min (ref >60.00)
GLUCOSE: 71 mg/dL (ref 70–99)
Potassium: 3.5 mEq/L (ref 3.5–5.1)
SODIUM: 140 meq/L (ref 135–145)
Total Protein: 7.5 g/dL (ref 6.0–8.3)

## 2016-04-08 LAB — TSH: TSH: 0.27 u[IU]/mL — ABNORMAL LOW (ref 0.35–4.50)

## 2016-04-08 LAB — T4, FREE: Free T4: 0.91 ng/dL (ref 0.60–1.60)

## 2016-04-08 LAB — T3, FREE: T3, Free: 3 pg/mL (ref 2.3–4.2)

## 2016-04-15 ENCOUNTER — Ambulatory Visit: Payer: 59 | Admitting: Endocrinology

## 2016-04-22 ENCOUNTER — Other Ambulatory Visit: Payer: Self-pay | Admitting: Endocrinology

## 2016-04-22 NOTE — Telephone Encounter (Signed)
Patient need refill for phentermine 15 MG capsule, send to  [facility] [id number], [place] [id number] (Phone) [phone] (Fax)

## 2016-04-25 NOTE — Telephone Encounter (Signed)
Patient need refill for phentermine 15 MG capsule, send to  [facility] [id number], [place] [id number] (Phone) [phone] (Fax)

## 2016-04-26 NOTE — Telephone Encounter (Signed)
Pt needs refills on her phentermine to [facility] asap please

## 2016-04-27 ENCOUNTER — Telehealth: Payer: Self-pay | Admitting: Endocrinology

## 2016-04-27 NOTE — Telephone Encounter (Signed)
Rx has been printed and signed by MD. Rx faxed.

## 2016-04-27 NOTE — Telephone Encounter (Signed)
Pt is asking for for the phentermine to be called in asap pt states she has been waiting for almost a full week for this rx, please inform me if there is an issue and if I can assist

## 2016-05-16 ENCOUNTER — Other Ambulatory Visit: Payer: Self-pay | Admitting: Endocrinology

## 2016-05-18 ENCOUNTER — Other Ambulatory Visit: Payer: Self-pay

## 2016-05-18 ENCOUNTER — Ambulatory Visit (INDEPENDENT_AMBULATORY_CARE_PROVIDER_SITE_OTHER): Payer: 59 | Admitting: Endocrinology

## 2016-05-18 ENCOUNTER — Encounter: Payer: Self-pay | Admitting: Endocrinology

## 2016-05-18 VITALS — BP 106/64 | HR 89 | Ht 66.0 in | Wt 158.0 lb

## 2016-05-18 DIAGNOSIS — E041 Nontoxic single thyroid nodule: Secondary | ICD-10-CM

## 2016-05-18 DIAGNOSIS — E876 Hypokalemia: Secondary | ICD-10-CM | POA: Diagnosis not present

## 2016-05-18 MED ORDER — TOPIRAMATE 50 MG PO TABS: 50.0000 mg | ORAL_TABLET | Freq: Two times a day (BID) | ORAL | 1 refills | Status: DC

## 2016-05-18 MED ORDER — PHENTERMINE HCL 15 MG PO CAPS: ORAL_CAPSULE | ORAL | 2 refills | Status: DC

## 2016-05-18 NOTE — Progress Notes (Signed)
Kathleen Burton is a 55 y.o. female.    Chief complaint: Followup of thyroid and weight management    History of Present Illness:   Problem 1: Weight management  Because of a weight gain of about 15 pounds and difficulty with losing weight with diet and exercise regimen she wanted weight loss medications.  She was asked to try Qsymia but because of lack of coverage she was given the equivalent doses in generic form She continues on phentermine  15 mg daily and Topamax to 50 mg daily with which she had improved satiety and had significant weight loss  She has not had any formal dietitian consultation; she is generally following healthy diet However recently she has not been consistent with diet partly because of not planning meals and eating snacks at work including junk food.  In the last few days she has tried to watch her diet better  She has been inconsistent with her exercise also in the last few weeks but more recently has been trying to go out walking 30-45 minutes either outside or someone the treadmill Her weight has gone up slightly since she was seen in 12/16   Wt Readings from Last 3 Encounters:  05/18/16 158 lb (71.7 kg)  03/02/16 154 lb (69.9 kg)  10/16/15 156 lb 9.6 oz (71 kg)    Problem 2:  Thyroid nodule which was biopsied in 04/2013 and was found to be benign. This measured about 3 cm previously on exam Also  had mildly low TSH without high free T4; has not had a nuclear thyroid scan previously  TSH was improved on 06/03/14 but is again  slightly low without increase in free T4 Or free T3 She feels well overall and has no palpitations or heat intolerance, has some hot flashes  Lab Results  Component Value Date   TSH 0.27 (L) 04/08/2016   TSH 0.28 (L) 10/14/2015   TSH 0.30 (L) 04/08/2015   FREET4 0.91 04/08/2016   FREET4 0.98 10/14/2015   FREET4 0.96 04/08/2015        Medication List       Accurate as of 05/18/16 10:54 AM. Always use your most recent  med list.          calcium carbonate 600 MG Tabs tablet Commonly known as:  OS-CAL Take 600 mg by mouth 2 (two) times daily with a meal. Reported on 03/02/2016   multivitamin tablet Take 1 tablet by mouth daily.   phentermine 15 MG capsule TAKE 1 CAP BY MOUTH ONCE DAILY   topiramate 50 MG tablet Commonly known as:  TOPAMAX Take 1 tablet by mouth two  times daily       Allergies:  Allergies  Allergen Reactions  . Latex Swelling    Past Medical History:  Diagnosis Date  . Anemia   . Dysmenorrhea   . Herpes   . Thyroid nodule    bx 04-2013    Past Surgical History:  Procedure Laterality Date  . BREAST SURGERY     biospy  . FOOT SURGERY Bilateral   . NOVASURE ABLATION      Family History  Problem Relation Age of Onset  . Hypertension Mother     Jerilynn Mages- brother   . Leukemia Mother     deceased  . Diabetes Brother   . Breast cancer Maternal Aunt     aunt-cousin  . Hypothyroidism Sister     had thyroid surgery  . Colon cancer Neg Hx   . CAD  Neg Hx     Social History:  reports that she has never smoked. She has never used smokeless tobacco. She reports that she does not drink alcohol or use drugs.  Review of Systems   Appears to have mild hypokalemia  LABS:  No visits with results within 1 Week(s) from this visit.  Latest known visit with results is:  Lab on 04/08/2016  Component Date Value Ref Range Status  . Sodium 04/08/2016 140  135 - 145 mEq/L Final  . Potassium 04/08/2016 3.5  3.5 - 5.1 mEq/L Final  . Chloride 04/08/2016 109  96 - 112 mEq/L Final  . CO2 04/08/2016 24  19 - 32 mEq/L Final  . Glucose, Bld 04/08/2016 71  70 - 99 mg/dL Final  . BUN 04/08/2016 9  6 - 23 mg/dL Final  . Creatinine, Ser 04/08/2016 0.63  0.40 - 1.20 mg/dL Final  . Total Bilirubin 04/08/2016 0.4  0.2 - 1.2 mg/dL Final  . Alkaline Phosphatase 04/08/2016 88  39 - 117 U/L Final  . AST 04/08/2016 15  0 - 37 U/L Final  . ALT 04/08/2016 14  0 - 35 U/L Final  . Total Protein  04/08/2016 7.5  6.0 - 8.3 g/dL Final  . Albumin 04/08/2016 4.2  3.5 - 5.2 g/dL Final  . Calcium 04/08/2016 10.0  8.4 - 10.5 mg/dL Final  . GFR 04/08/2016 126.46  >60.00 mL/min Final  . Free T4 04/08/2016 0.91  0.60 - 1.60 ng/dL Final  . TSH 04/08/2016 0.27* 0.35 - 4.50 uIU/mL Final  . T3, Free 04/08/2016 3.0  2.3 - 4.2 pg/mL Final    EXAM:  BP 106/64 (BP Location: Left Arm, Patient Position: Sitting, Cuff Size: Normal)   Pulse 89   Ht 5\' 6"  (1.676 m)   Wt 158 lb (71.7 kg)   SpO2 96%   BMI 25.50 kg/m   Repeat pulse 80, regular  Thyroid nodule is about 2.5 cm, smooth and firm, palpable in the isthmus area.   No neck lymphadenopathy  Assessment/Plan:   Obesity with baseline BMI 33:  She is on combination of low-dose phentermine and Topamax daily  which has previously helped her with weight loss and has been able to maintain most of the weight loss She has however been inconsistent with her diet and exercise regimen Since her last visit has only gained 2 pounds however She thinks she can be more committed now to consistent diet and exercise and has been walking more regularly recently  No side effects like tachycardia, she tends to have a high pulse on her first arrival  THYROID nodule: Clinically unchanged in size on exam She has a benign nodule proven by biopsy  TSH level is still slightly low without change in free T4, will continue to monitor   Mild hypokalemia: This is 3.5, asymptomatic  Velita Quirk 05/18/2016, 10:54 AM

## 2016-08-19 ENCOUNTER — Other Ambulatory Visit: Payer: Self-pay

## 2016-08-19 ENCOUNTER — Telehealth: Payer: Self-pay | Admitting: Endocrinology

## 2016-08-19 MED ORDER — PHENTERMINE HCL 15 MG PO CAPS: ORAL_CAPSULE | ORAL | 2 refills | Status: DC

## 2016-08-19 NOTE — Telephone Encounter (Signed)
Pt is asking for refill for phentermine please called in to hold her until her appt in February printed for her to pick up   Please call when ready

## 2016-08-19 NOTE — Telephone Encounter (Signed)
Please print the prescription

## 2016-08-23 ENCOUNTER — Telehealth: Payer: Self-pay | Admitting: Endocrinology

## 2016-08-23 NOTE — Telephone Encounter (Signed)
Patient calling on the status of her medication, she said she would come and pick it up. Please advise

## 2016-08-25 ENCOUNTER — Other Ambulatory Visit: Payer: Self-pay

## 2016-08-25 ENCOUNTER — Other Ambulatory Visit: Payer: Self-pay | Admitting: Endocrinology

## 2016-08-25 MED ORDER — PHENTERMINE HCL 15 MG PO CAPS: ORAL_CAPSULE | ORAL | 2 refills | Status: DC

## 2016-08-25 NOTE — Telephone Encounter (Signed)
Pt called to see if her Phentermine script was ready to be picked up, please advise and call patient.

## 2016-08-25 NOTE — Telephone Encounter (Signed)
Left voice mail explaining the reason she is not getting the prescription filled was because her insurance will not cover it. I left a message for her that I can print out a new script for her to pick up but it will be out of pocket expence

## 2016-08-26 NOTE — Telephone Encounter (Signed)
Do you know if this prescription was called into pt's pharmacy? It was approved on 08/25/2016.

## 2016-09-16 ENCOUNTER — Encounter: Payer: Self-pay | Admitting: Family Medicine

## 2016-09-16 ENCOUNTER — Ambulatory Visit (INDEPENDENT_AMBULATORY_CARE_PROVIDER_SITE_OTHER): Payer: 59 | Admitting: Family Medicine

## 2016-09-16 ENCOUNTER — Telehealth: Payer: Self-pay | Admitting: Internal Medicine

## 2016-09-16 ENCOUNTER — Ambulatory Visit (INDEPENDENT_AMBULATORY_CARE_PROVIDER_SITE_OTHER)
Admission: RE | Admit: 2016-09-16 | Discharge: 2016-09-16 | Disposition: A | Discharge status: Home / Self Care | Payer: 59 | Source: Ambulatory Visit | Attending: Family Medicine | Admitting: Family Medicine

## 2016-09-16 VITALS — BP 140/90 | HR 98 | Resp 12 | Ht 66.0 in | Wt 163.1 lb

## 2016-09-16 DIAGNOSIS — R319 Hematuria, unspecified: Secondary | ICD-10-CM

## 2016-09-16 DIAGNOSIS — N390 Urinary tract infection, site not specified: Secondary | ICD-10-CM | POA: Diagnosis not present

## 2016-09-16 DIAGNOSIS — R109 Unspecified abdominal pain: Secondary | ICD-10-CM | POA: Diagnosis not present

## 2016-09-16 DIAGNOSIS — R399 Unspecified symptoms and signs involving the genitourinary system: Secondary | ICD-10-CM | POA: Diagnosis not present

## 2016-09-16 DIAGNOSIS — N200 Calculus of kidney: Secondary | ICD-10-CM

## 2016-09-16 LAB — POCT URINALYSIS DIPSTICK
Bilirubin, UA: NEGATIVE
Blood, UA: POSITIVE
Glucose, UA: NEGATIVE
KETONES UA: NEGATIVE
LEUKOCYTES UA: NEGATIVE
Nitrite, UA: NEGATIVE
PROTEIN UA: NEGATIVE
Spec Grav, UA: 1.015
UROBILINOGEN UA: 0.2
pH, UA: 7.5

## 2016-09-16 MED ORDER — NITROFURANTOIN MONOHYD MACRO 100 MG PO CAPS: 100.0000 mg | ORAL_CAPSULE | Freq: Two times a day (BID) | ORAL | 0 refills | Status: DC

## 2016-09-16 MED ORDER — NITROFURANTOIN MONOHYD MACRO 100 MG PO CAPS: 100.0000 mg | ORAL_CAPSULE | Freq: Two times a day (BID) | ORAL | 0 refills | Status: AC

## 2016-09-16 MED ORDER — TAMSULOSIN HCL 0.4 MG PO CAPS: 0.4000 mg | ORAL_CAPSULE | Freq: Every day | ORAL | 0 refills | Status: AC

## 2016-09-16 NOTE — Progress Notes (Addendum)
HPI:  ACUTE VISIT:  Chief Complaint  Patient presents with  . Urinary Tract Infection    started around 2 am this morning, pain in side.     KathleenKathleen Burton is a 55 y.o. female, who is here today complaining of Sudden onset of left low back pain radiated to left flank. Pain is intermittently, mild, she has not noted exacerbating or alleviating factors. She mentions that when pain started she felt the urge of having a bowel movement, she denies any change in bowel habits, nausea, or vomiting.  According to patient 1-2 hours before pain started she noted "very dark urine", she thought it was prolapse but didn't see good blood on tissue after wiping. She has had a couple episodes of dark urine since pain started, still not sure if his blood. She denies any history of nephrolithiasis.  She has not noted fever, chills, or myalgias.   Dysuria: Denies Urinary frequency: According to patient, she has history of urinary frequency and is unchanged. Urinary urgency: Denies Incontinence: Denies   Feeling like she needs to have a bm.   Abnormal vaginal bleeding or discharge: Denies  [id number] menopausal Sexual activity:Yes Hx of UTI: No. 01/2015 urine dip 3+ blood. According to patient, around this time she didn't have any urinary symptom; she was prescribed antibiotic treatment after urine culture was back.  No smoker.  OTC medications for this problem: None   Review of Systems  Constitutional: Negative for activity change, appetite change, fatigue and fever.  Respiratory: Negative for cough, shortness of breath and wheezing.   Gastrointestinal: Negative for abdominal pain, nausea and vomiting.       No changes in bowel habits.  Genitourinary: Positive for flank pain, frequency (no more than usual) and hematuria. Negative for decreased urine volume, difficulty urinating, dysuria, pelvic pain, urgency, vaginal bleeding and vaginal discharge.  Musculoskeletal: Positive for  back pain. Negative for myalgias.  Neurological: Negative for syncope and weakness.      Current Outpatient Prescriptions on File Prior to Visit  Medication Sig Dispense Refill  . calcium carbonate (OS-CAL) 600 MG TABS Take 600 mg by mouth 2 (two) times daily with a meal. Reported on 03/02/2016    . Multiple Vitamin (MULTIVITAMIN) tablet Take 1 tablet by mouth daily.    . phentermine 15 MG capsule TAKE ONE CAPSULE BY MOUTH DAILY 30 capsule 2  . topiramate (TOPAMAX) 50 MG tablet Take 1 tablet (50 mg total) by mouth 2 (two) times daily. 180 tablet 1   No current facility-administered medications on file prior to visit.      Past Medical History:  Diagnosis Date  . Anemia   . Dysmenorrhea   . Herpes   . Thyroid nodule    bx 04-2013   Allergies  Allergen Reactions  . Latex Swelling    Social History   Social History  . Marital status: Married    Spouse name: N/A  . Number of children: 1  . Years of education: N/A   Occupational History  . dep. 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 Smoking status: Never Smoker  . Smokeless tobacco: Never Used  . Alcohol use No  . Drug use: No  . Sexual activity: Yes    Birth control/ protection: Other-see comments   Other Topics Concern  . None   Social History Narrative   Lives w/ husband, child is at college    Vitals:  09/16/16 1050  BP: 140/90  Pulse: 98  Resp: 12   Body mass index is 26.33 kg/m.    Physical Exam  Nursing note and vitals reviewed. Constitutional: She is oriented to person, place, and time. She appears well-developed and well-nourished. No distress.  HENT:  Head: Atraumatic.  Mouth/Throat: Oropharynx is clear and moist and mucous membranes are normal.  Eyes: Conjunctivae are normal.  Respiratory: Effort normal and breath sounds normal. No respiratory distress.  GI: Soft. Bowel sounds are normal. She exhibits no mass. There is no hepatomegaly.  There is no tenderness. There is no CVA tenderness.  Musculoskeletal: She exhibits no edema or tenderness.  No tenderness upon palpation of paraspinal lumbar muscles bilaterally.  Neurological: She is alert and oriented to person, place, and time. Coordination and gait normal.  Skin: Skin is warm. No rash noted. No erythema.  Psychiatric: Her speech is normal. Her mood appears anxious.  Well groomed, good eye contact.      ASSESSMENT AND PLAN:     Merlina was seen today for urinary tract infection.  Diagnoses and all orders for this visit:    UTI symptoms  We discussed possible causes of urinary frequency, which seems to be chronic.   -     POCT urinalysis dipstick  Hematuria, unspecified type  Today pain imaging was ordered, urine culture is pending. Noted urine dipstick in April 2016 with 3+ blood. Recommended increasing fluid intake. Monitor for new symptoms. I recommended following with PCP and repeat urine microscopic while she is asymptomatic. She may need further workup and/or urology evaluation.  -     DG Abd 1 View; Future  Urinary tract infection with hematuria, site unspecified   Ucx ordered.  Empiric abx treatment started today and will be tailored according to Ucx results and susceptibility report.  Clearly instructed about warning signs. F/U if symptoms persist.  -     Urine culture  -     nitrofurantoin, macrocrystal-monohydrate, (MACROBID) 100 MG capsule; Take 1 capsule (100 mg total) by mouth 2 (two) times daily.  Flank pain, acute  ? Of nephrolithiasis. Further recommendations would be given according to imaging report. Instructed about warning signs.  -     DG Abd 1 View; Future  Nephrolithiasis  Acute episode. Flomax sent to pharmacy after imaging report of KUB received. CT abd/pelvis w/o contrast will be arranged. Instructions were sent to Ms Henthorn through [facility] but we also discussed management of nephrolithiasis during OV her  today.    Return in 4 weeks (on 10/14/2016) for with PCP please.     -KathleenANNYA Burton was advised to return or notify a doctor immediately if symptoms worsen or new concerns arise, she voices understanding.       Betty G. Martinique, MD  [facility]. [place] office.

## 2016-09-16 NOTE — Patient Instructions (Signed)
  [name] I have seen you today for an acute visit.  1. UTI symptoms  - POCT urinalysis dipstick  2. Hematuria, unspecified type  - DG Abd 1 View; Future  3. Urinary tract infection with hematuria, site unspecified  - Urine culture - nitrofurantoin, macrocrystal-monohydrate, (MACROBID) 100 MG capsule; Take 1 capsule (100 mg total) by mouth 2 (two) times daily.  Dispense: 10 capsule; Refill: 0    Adequate fluid intake, avoid holding urine for long hours, and over the counter Vit C OR cranberry capsules might help.  Today we will treat empirically with antibiotic, which we might need to change when urine culture comes back depending of bacteria susceptibility.  Seek immediate medical attention if severe abdominal pain, vomiting, fever/chills, or worsening symptoms. F/U sooner than planned if symptomatic are not any better after 2-3 days of antibiotic treatment.   In general please monitor for signs of worsening symptoms and seek immediate medical attention if any concerning/warning symptom as we discussed.    Please be sure you have an appointment already scheduled with your PCP before you leave today.

## 2016-09-16 NOTE — Telephone Encounter (Signed)
Pt has an appt scheduled with Dr. Martinique today at 10:45 am.

## 2016-09-16 NOTE — Addendum Note (Signed)
Addended by: Martinique, Deyci Gesell G on: 09/16/2016 07:17 PM   Modules accepted: Orders

## 2016-09-16 NOTE — Telephone Encounter (Signed)
[facility] [place] Night - Client TELEPHONE ADVICE RECORD [facility]  Patient Name: Kathleen Burton  DOB: 11/16/1960    Initial Comment caller states she has blood in her urine and flank pain   Nurse Assessment  Nurse: Wynetta Emery, RN, Baker Janus Date/Time Eilene Ghazi Time): 09/16/2016 8:37:42 AM  Confirm and document reason for call. If symptomatic, describe symptoms. ---Has been up all night with around 200am with painful urination and pain in her left flank and back pain. severe to painful to drive to Dr. Larose Kells office but needs to be seen-- asking to be seen in another location.  Does the patient have any new or worsening symptoms? ---Yes  Will a triage be completed? ---Yes  Related visit to physician within the last 2 weeks? ---No  Does the PT have any chronic conditions? (i.e. diabetes, asthma, etc.) ---Unknown  Is the patient pregnant or possibly pregnant? (Ask all females between the ages of 50-55) ---No  Is this a behavioral health or substance abuse call? ---No     Guidelines    Guideline Title Affirmed Question Affirmed Notes  Urination Pain - Female Side (flank) or lower back pain present    Final Disposition User   See Physician within 4 Hours (or PCP triage) Wynetta Emery, RN, Baker Janus    Comments  NOTE: Nurse was advised she does not have anyone to drive her and she has been up most of night with pain in flank and back; painful urination. Is asking if she can be seen in office closer to her she is in extreme pain and does not feel she can make it to [place]. Nurse checked [facility] appt and was given an appt with Dr. Martinique at 1030arrival time and 1045appt time. 09/16/2016   Referrals  REFERRED TO PCP OFFICE   Disagree/Comply: Comply

## 2016-09-16 NOTE — Progress Notes (Signed)
Pre visit review using our clinic review tool, if applicable. No additional management support is needed unless otherwise documented below in the visit note. 

## 2016-09-19 LAB — URINE CULTURE

## 2016-09-20 ENCOUNTER — Telehealth: Payer: Self-pay | Admitting: Internal Medicine

## 2016-09-20 NOTE — Telephone Encounter (Signed)
Left voicemail for patient to call the office back.   

## 2016-09-20 NOTE — Telephone Encounter (Signed)
Informed patient of results and patient verbalized understanding. See lab note.

## 2016-09-20 NOTE — Telephone Encounter (Signed)
Pt was dr Martinique yesterday and returning sarah call

## 2016-09-23 ENCOUNTER — Ambulatory Visit
Admission: RE | Admit: 2016-09-23 | Discharge: 2016-09-23 | Disposition: A | Discharge status: Home / Self Care | Payer: 59 | Source: Ambulatory Visit | Attending: Family Medicine | Admitting: Family Medicine

## 2016-09-23 DIAGNOSIS — N200 Calculus of kidney: Secondary | ICD-10-CM

## 2016-09-23 DIAGNOSIS — R319 Hematuria, unspecified: Secondary | ICD-10-CM

## 2016-09-23 DIAGNOSIS — R109 Unspecified abdominal pain: Secondary | ICD-10-CM

## 2016-09-25 ENCOUNTER — Encounter: Payer: Self-pay | Admitting: Family Medicine

## 2016-09-25 ENCOUNTER — Other Ambulatory Visit: Payer: Self-pay | Admitting: Family Medicine

## 2016-09-25 DIAGNOSIS — N200 Calculus of kidney: Secondary | ICD-10-CM

## 2016-09-27 ENCOUNTER — Telehealth: Payer: Self-pay | Admitting: Internal Medicine

## 2016-09-27 NOTE — Telephone Encounter (Signed)
Pt is returning sara call °

## 2016-09-27 NOTE — Telephone Encounter (Signed)
Called and spoke with patient. Patient was wondering about the referral. Advised patient referral has already been placed, she should get a call at any time to set up an appointment. Patient verbalized understanding.

## 2016-10-07 ENCOUNTER — Other Ambulatory Visit: Payer: Self-pay | Admitting: Urology

## 2016-10-25 ENCOUNTER — Telehealth: Payer: Self-pay | Admitting: Endocrinology

## 2016-10-25 NOTE — Telephone Encounter (Signed)
Patient need a refill of medication phentermine 15 MG capsule.    Pharmacy stated  NPI or tax ID need to be updated in order to get a refill.  [facility] [place], [address] [place] [id number] (Phone) [phone] (Fax)

## 2016-10-28 ENCOUNTER — Other Ambulatory Visit: Payer: Self-pay

## 2016-10-28 MED ORDER — PHENTERMINE HCL 15 MG PO CAPS: 15.0000 mg | ORAL_CAPSULE | Freq: Every day | ORAL | 2 refills | Status: DC

## 2016-10-28 NOTE — Telephone Encounter (Signed)
Ordered 10/28/16 

## 2016-11-01 ENCOUNTER — Encounter (HOSPITAL_COMMUNITY): Payer: Self-pay | Admitting: *Deleted

## 2016-11-02 ENCOUNTER — Encounter (HOSPITAL_COMMUNITY): Payer: Self-pay | Admitting: *Deleted

## 2016-11-03 ENCOUNTER — Ambulatory Visit (HOSPITAL_COMMUNITY): Payer: 59

## 2016-11-03 ENCOUNTER — Encounter (HOSPITAL_COMMUNITY): Payer: Self-pay | Admitting: *Deleted

## 2016-11-03 ENCOUNTER — Encounter (HOSPITAL_COMMUNITY)
Admission: RE | Disposition: A | Discharge status: Home / Self Care | Payer: Self-pay | Source: Ambulatory Visit | Attending: Urology

## 2016-11-03 ENCOUNTER — Ambulatory Visit (HOSPITAL_COMMUNITY)
Admission: RE | Admit: 2016-11-03 | Discharge: 2016-11-03 | Disposition: A | Discharge status: Home / Self Care | Payer: 59 | Source: Ambulatory Visit | Attending: Urology | Admitting: Urology

## 2016-11-03 DIAGNOSIS — Z79899 Other long term (current) drug therapy: Secondary | ICD-10-CM | POA: Insufficient documentation

## 2016-11-03 DIAGNOSIS — Z87442 Personal history of urinary calculi: Secondary | ICD-10-CM | POA: Diagnosis not present

## 2016-11-03 DIAGNOSIS — N200 Calculus of kidney: Secondary | ICD-10-CM | POA: Diagnosis not present

## 2016-11-03 DIAGNOSIS — R109 Unspecified abdominal pain: Secondary | ICD-10-CM | POA: Diagnosis not present

## 2016-11-03 DIAGNOSIS — Z79891 Long term (current) use of opiate analgesic: Secondary | ICD-10-CM | POA: Insufficient documentation

## 2016-11-03 DIAGNOSIS — N201 Calculus of ureter: Secondary | ICD-10-CM

## 2016-11-03 HISTORY — PX: EXTRACORPOREAL SHOCK WAVE LITHOTRIPSY: SHX1557

## 2016-11-03 HISTORY — DX: Personal history of urinary calculi: Z87.442

## 2016-11-03 LAB — PREGNANCY, URINE: Preg Test, Ur: NEGATIVE

## 2016-11-03 SURGERY — LITHOTRIPSY, ESWL
Anesthesia: LOCAL | Laterality: Left

## 2016-11-03 MED ORDER — DIPHENHYDRAMINE HCL 25 MG PO CAPS
25.0000 mg | ORAL_CAPSULE | ORAL | Status: AC
  Administered 2016-11-03: 25 mg via ORAL
  Filled 2016-11-03: qty 1

## 2016-11-03 MED ORDER — SODIUM CHLORIDE 0.9 % IV SOLN
INTRAVENOUS | Status: DC
  Administered 2016-11-03: 11:00:00 via INTRAVENOUS

## 2016-11-03 MED ORDER — DIAZEPAM 5 MG PO TABS
10.0000 mg | ORAL_TABLET | ORAL | Status: AC
  Administered 2016-11-03: 10 mg via ORAL
  Filled 2016-11-03: qty 2

## 2016-11-03 MED ORDER — ONDANSETRON HCL 4 MG PO TABS: 4.0000 mg | ORAL_TABLET | Freq: Three times a day (TID) | ORAL | 0 refills | Status: DC | PRN

## 2016-11-03 MED ORDER — CIPROFLOXACIN HCL 500 MG PO TABS
500.0000 mg | ORAL_TABLET | ORAL | Status: AC
  Administered 2016-11-03: 500 mg via ORAL
  Filled 2016-11-03: qty 1

## 2016-11-03 NOTE — Op Note (Signed)
Left Lower pole stones   Left Lower pole ESWL   Pt tolerated well. Good fragmentation - may need staged procedure.

## 2016-11-03 NOTE — Discharge Instructions (Signed)
Lithotripsy, Care After °Refer to this sheet in the next few weeks. These instructions provide you with information on caring for yourself after your procedure. Your health care provider may also give you more specific instructions. Your treatment has been planned according to current medical practices, but problems sometimes occur. Call your health care provider if you have any problems or questions after your procedure. °WHAT TO EXPECT AFTER THE PROCEDURE  °· Your urine may have a red tinge for a few days after treatment. Blood loss is usually minimal. °· You may have soreness in the back or flank area. This usually goes away after a few days. The procedure can cause blotches or bruises on the back where the pressure wave enters the skin. These marks usually cause only minimal discomfort and should disappear in a short time. °· Stone fragments should begin to pass within 24 hours of treatment. However, a delayed passage is not unusual. °· You may have pain, discomfort, and feel sick to your stomach (nauseated) when the crushed fragments of stone are passed down the tube from the kidney to the bladder. Stone fragments can pass soon after the procedure and may last for up to 4-8 weeks. °· A small number of patients may have severe pain when stone fragments are not able to pass, which leads to an obstruction. °· If your stone is greater than 1 inch (2.5 cm) in diameter or if you have multiple stones that have a combined diameter greater than 1 inch (2.5 cm), you may require more than one treatment. °· If you had a stent placed prior to your procedure, you may experience some discomfort, especially during urination. You may experience the pain or discomfort in your flank or back, or you may experience a sharp pain or discomfort at the base of your penis or in your lower abdomen. The discomfort usually lasts only a few minutes after urinating. °HOME CARE INSTRUCTIONS  °· Rest at home until you feel your energy  improving. °· Only take over-the-counter or prescription medicines for pain, discomfort, or fever as directed by your health care provider. Depending on the type of lithotripsy, you may need to take antibiotics and anti-inflammatory medicines for a few days. °· Drink enough water and fluids to keep your urine clear or pale yellow. This helps "flush" your kidneys. It helps pass any remaining pieces of stone and prevents stones from coming back. °· Most people can resume daily activities within 1-2 days after standard lithotripsy. It can take longer to recover from laser and percutaneous lithotripsy. °· Strain all urine through the provided strainer. Keep all particulate matter and stones for your health care provider to see. The stone may be as small as a grain of salt. It is very important to use the strainer each and every time you pass your urine. Any stones that are found can be sent to a medical lab for examination. °· Visit your health care provider for a follow-up appointment in a few weeks. Your doctor may remove your stent if you have one. Your health care provider will also check to see whether stone particles still remain. °SEEK MEDICAL CARE IF:  °· Your pain is not relieved by medicine. °· You have a lasting nauseous feeling. °· You feel there is too much blood in the urine. °· You develop persistent problems with frequent or painful urination that does not at least partially improve after 2 days following the procedure. °· You have a congested cough. °· You feel   lightheaded.  You develop a rash or any other signs that might suggest an allergic problem.  You develop any reaction or side effects to your medicine(s). SEEK IMMEDIATE MEDICAL CARE IF:   You experience severe back or flank pain or both.  You see nothing but blood when you urinate.  You cannot pass any urine at all.  You have a fever or shaking chills.  You develop shortness of breath, difficulty breathing, or chest pain.  You  develop vomiting that will not stop after 6-8 hours.  You have a fainting episode. This information is not intended to replace advice given to you by your health care provider. Make sure you discuss any questions you have with your health care provider. Document Released: 10/23/2007 Document Revised: 06/24/2015 Document Reviewed: 04/18/2013 Elsevier Interactive Patient Education  2017 [employer].

## 2016-11-03 NOTE — H&P (Signed)
H&P  Chief Complaint: left renal stones  History of Present Illness: Pt passing a 6 mm and 7 mm left distal stones. She passed these a few days ago and saw them. KUB today confirms they passed. She has two stones in the LLP - the largest about 6 mm. I went over the KUB image with the patient and her husband and drew them a picture of the anatomy. I discussed with them the nature r/b/a to Left ESWL of the left lower pole stones. I discussed the decreased stone clearance and increased risk of bleeding with lower pole renal ESWL compared to distal ureteral ESWL among other risks. She has been well with no dysuria or fever. She elects to proceed with LLP ESWL.   Past Medical History:  Diagnosis Date  . Anemia   . Dysmenorrhea   . Herpes   . History of kidney stones   . Thyroid nodule    bx 04-2013   Past Surgical History:  Procedure Laterality Date  . BREAST SURGERY     biospy  . FOOT SURGERY Bilateral   . NOVASURE ABLATION      Home Medications:  Prescriptions Prior to Admission  Medication Sig Dispense Refill Last Dose  . Multiple Vitamin (MULTIVITAMIN) tablet Take 1 tablet by mouth daily.   Past Month at Unknown time  . phentermine 15 MG capsule Take 1 capsule (15 mg total) by mouth daily. 30 capsule 2 11/02/2016 at 0700  . tamsulosin (FLOMAX) 0.4 MG CAPS capsule Take 0.4 mg by mouth.   Past Month at Unknown time  . topiramate (TOPAMAX) 50 MG tablet Take 1 tablet (50 mg total) by mouth 2 (two) times daily. 180 tablet 1 11/02/2016 at 0700  . calcium carbonate (OS-CAL) 600 MG TABS Take 600 mg by mouth 2 (two) times daily with a meal. Reported on 03/02/2016   Taking  . HYDROcodone-acetaminophen (NORCO/VICODIN) 5-325 MG tablet Take 1-2 tablets by mouth.      Allergies:  Allergies  Allergen Reactions  . Latex Swelling    Family History  Problem Relation Age of Onset  . Hypertension Mother     Jerilynn Mages- brother   . Leukemia Mother     deceased  . Diabetes Brother   . Breast cancer  Maternal Aunt     aunt-cousin  . Hypothyroidism Sister     had thyroid surgery  . Colon cancer Neg Hx   . CAD Neg Hx    Social History:  reports that she has never smoked. She has never used smokeless tobacco. She reports that she does not drink alcohol or use drugs.  ROS: A complete review of systems was performed.  All systems are negative except for pertinent findings as noted. ROS   Physical Exam:  Vital signs in last 24 hours: Temp:  [97.9 F (36.6 C)] 97.9 F (36.6 C) (01/18 1030) Pulse Rate:  [84] 84 (01/18 1030) Resp:  [16] 16 (01/18 1030) BP: (133)/(81) 133/81 (01/18 1030) SpO2:  [100 %] 100 % (01/18 1030) Weight:  [70.5 kg (155 lb 6 oz)] 70.5 kg (155 lb 6 oz) (01/18 1030) General:  Alert and oriented, No acute distress HEENT: Normocephalic, atraumatic Cardiovascular: Regular rate and rhythm Lungs: Regular rate and effort Abdomen: Soft, nontender, nondistended, no abdominal masses Extremities: No edema Neurologic: Grossly intact  Laboratory Data:  Results for orders placed or performed during the hospital encounter of 11/03/16 (from the past 24 hour(s))  Pregnancy, urine     Status: None  Collection Time: 11/03/16 10:19 AM  Result Value Ref Range   Preg Test, Ur NEGATIVE NEGATIVE   No results found for this or any previous visit (from the past 240 hour(s)). Creatinine: No results for input(s): CREATININE in the last 168 hours.  Impression/Assessment:  Left lower pole stones -   Plan:  Left lower pole ESWL   Elman Dettman 11/03/2016, 1:14 PM

## 2016-11-15 ENCOUNTER — Other Ambulatory Visit (INDEPENDENT_AMBULATORY_CARE_PROVIDER_SITE_OTHER): Payer: 59

## 2016-11-15 DIAGNOSIS — E041 Nontoxic single thyroid nodule: Secondary | ICD-10-CM

## 2016-11-15 DIAGNOSIS — E876 Hypokalemia: Secondary | ICD-10-CM | POA: Diagnosis not present

## 2016-11-15 LAB — BASIC METABOLIC PANEL
BUN: 13 mg/dL (ref 6–23)
CHLORIDE: 109 meq/L (ref 96–112)
CO2: 23 mEq/L (ref 19–32)
CREATININE: 0.71 mg/dL (ref 0.40–1.20)
Calcium: 10.2 mg/dL (ref 8.4–10.5)
GFR: 109.92 mL/min (ref >60.00)
Glucose, Bld: 79 mg/dL (ref 70–99)
Potassium: 4 mEq/L (ref 3.5–5.1)
Sodium: 138 mEq/L (ref 135–145)

## 2016-11-15 LAB — TSH: TSH: 0.48 u[IU]/mL (ref 0.35–4.50)

## 2016-11-15 LAB — T4, FREE: Free T4: 0.86 ng/dL (ref 0.60–1.60)

## 2016-11-18 ENCOUNTER — Ambulatory Visit: Payer: 59 | Admitting: Endocrinology

## 2016-11-23 ENCOUNTER — Ambulatory Visit: Payer: 59 | Admitting: Endocrinology

## 2016-11-24 ENCOUNTER — Ambulatory Visit (INDEPENDENT_AMBULATORY_CARE_PROVIDER_SITE_OTHER): Payer: 59 | Admitting: Endocrinology

## 2016-11-24 ENCOUNTER — Encounter: Payer: Self-pay | Admitting: Endocrinology

## 2016-11-24 VITALS — BP 108/78 | HR 92 | Temp 97.9°F | Resp 16 | Ht 66.0 in | Wt 159.2 lb

## 2016-11-24 DIAGNOSIS — E041 Nontoxic single thyroid nodule: Secondary | ICD-10-CM | POA: Diagnosis not present

## 2016-11-24 DIAGNOSIS — E876 Hypokalemia: Secondary | ICD-10-CM | POA: Diagnosis not present

## 2016-11-24 MED ORDER — PHENTERMINE HCL 15 MG PO CAPS: 15.0000 mg | ORAL_CAPSULE | Freq: Every day | ORAL | 2 refills | Status: DC

## 2016-11-24 NOTE — Progress Notes (Signed)
Kathleen Burton is a 56 y.o. female.    Chief complaint: Followup of thyroid and weight management    History of Present Illness:   Problem 1: Weight management  Because of a weight gain of about 15 pounds and difficulty with losing weight with diet and exercise regimen she wanted weight loss medications.  She was asked to try Qsymia but because of lack of coverage she was given the equivalent doses in generic form She continues on phentermine  15 mg daily and Topamax to 50 mg daily with which she had improved satiety and had significant weight loss She has not had any formal dietitian consultation; she is usually trying to keep her portions and calories controlled  She has been trying to do some exercise with either walking outside or on her treadmill up to 45 minutes  Overall her weight is about the same as last year She does not feel any palpitations taking phentermine    Wt Readings from Last 3 Encounters:  11/24/16 159 lb 4 oz (72.2 kg)  11/03/16 155 lb 6 oz (70.5 kg)  09/16/16 163 lb 2 oz (74 kg)    Problem 2:  Thyroid nodule which was biopsied in 04/2013 and was found to be benign. This measured about 3 cm previously on exam Also had mildly low TSH without high free T4; has not had a nuclear thyroid scan previously  TSH was Getting low the last 2 times but now back to normal   She feels well overall and has no palpitations or heat intolerance, has some hot flashes  Lab Results  Component Value Date   TSH 0.48 11/15/2016   TSH 0.27 (L) 04/08/2016   TSH 0.28 (L) 10/14/2015   FREET4 0.86 11/15/2016   FREET4 0.91 04/08/2016   FREET4 0.98 10/14/2015      Allergies as of 11/24/2016      Reactions   Latex Swelling      Medication List       Accurate as of 11/24/16 10:16 AM. Always use your most recent med list.          HYDROcodone-acetaminophen 5-325 MG tablet Commonly known as:  NORCO/VICODIN Take 1-2 tablets by mouth.   multivitamin tablet Take 1 tablet  by mouth daily.   ondansetron 4 MG tablet Commonly known as:  ZOFRAN Take 1 tablet (4 mg total) by mouth every 8 (eight) hours as needed for nausea or vomiting.   phentermine 15 MG capsule Take 1 capsule (15 mg total) by mouth daily.   tamsulosin 0.4 MG Caps capsule Commonly known as:  FLOMAX Take 0.4 mg by mouth.   topiramate 50 MG tablet Commonly known as:  TOPAMAX Take 1 tablet (50 mg total) by mouth 2 (two) times daily.       Allergies:  Allergies  Allergen Reactions  . Latex Swelling    Past Medical History:  Diagnosis Date  . Anemia   . Dysmenorrhea   . Herpes   . History of kidney stones   . Thyroid nodule    bx 04-2013    Past Surgical History:  Procedure Laterality Date  . BREAST SURGERY     biospy  . FOOT SURGERY Bilateral   . NOVASURE ABLATION      Family History  Problem Relation Age of Onset  . Hypertension Mother     Jerilynn Mages- brother   . Leukemia Mother     deceased  . Diabetes Brother   . Breast cancer Maternal Aunt  aunt-cousin  . Hypothyroidism Sister     had thyroid surgery  . Colon cancer Neg Hx   . CAD Neg Hx     Social History:  reports that she has never smoked. She has never used smokeless tobacco. She reports that she does not drink alcohol or use drugs.  Review of Systems     LABS:  No visits with results within 1 Week(s) from this visit.  Latest known visit with results is:  Lab on 11/15/2016  Component Date Value Ref Range Status  . TSH 11/15/2016 0.48  0.35 - 4.50 uIU/mL Final  . Free T4 11/15/2016 0.86  0.60 - 1.60 ng/dL Final   Comment: Specimens from patients who are undergoing biotin therapy and /or ingesting biotin supplements may contain high levels of biotin.  The higher biotin concentration in these specimens interferes with this Free T4 assay.  Specimens that contain high levels  of biotin may cause false high results for this Free T4 assay.  Please interpret results in light of the total clinical presentation  of the patient.    . Sodium 11/15/2016 138  135 - 145 mEq/L Final  . Potassium 11/15/2016 4.0  3.5 - 5.1 mEq/L Final  . Chloride 11/15/2016 109  96 - 112 mEq/L Final  . CO2 11/15/2016 23  19 - 32 mEq/L Final  . Glucose, Bld 11/15/2016 79  70 - 99 mg/dL Final  . BUN 11/15/2016 13  6 - 23 mg/dL Final  . Creatinine, Ser 11/15/2016 0.71  0.40 - 1.20 mg/dL Final  . Calcium 11/15/2016 10.2  8.4 - 10.5 mg/dL Final  . GFR 11/15/2016 109.92  >60.00 mL/min Final    EXAM:  BP 108/78   Pulse 92   Temp 97.9 F (36.6 C) (Oral)   Resp 16   Ht 5\' 6"  (1.676 m)   Wt 159 lb 4 oz (72.2 kg)   SpO2 97%   BMI 25.70 kg/m   Pulse 96 on repeat testing   Thyroid nodule is about 2.5-2.7 cm, smooth and firm, palpable in the isthmus/left lobe  area.   Right lobe not palpable  No neck lymphadenopathy  Assessment/Plan:   Obesity with baseline BMI 33:  She is on combination of low-dose phentermine and Topamax daily   Her weight is about the same and she is trying to do a little better with her exercise regimen and diet lately Encourage her to continue being compliant with diet and exercise regimen Because of her tendency to mild tachycardia she will try to take phentermine every other day now May also be able to stop this eventually  THYROID nodule: Clinically unchanged in size on exam She has a benign nodule proven by biopsy  TSH level is  not low on this visit   Mild hypokalemia: This is  improved and she knows to continue adding high potassium foods in her diet which has helped her cramps  Blair Lundeen 11/24/2016, 10:16 AM

## 2016-11-24 NOTE — Patient Instructions (Signed)
Take phenteramine every 2 days

## 2016-12-06 DIAGNOSIS — N200 Calculus of kidney: Secondary | ICD-10-CM | POA: Diagnosis not present

## 2016-12-07 DIAGNOSIS — N200 Calculus of kidney: Secondary | ICD-10-CM | POA: Diagnosis not present

## 2017-01-10 ENCOUNTER — Encounter (HOSPITAL_COMMUNITY): Payer: Self-pay | Admitting: Urology

## 2017-03-06 ENCOUNTER — Other Ambulatory Visit: Payer: Self-pay | Admitting: Obstetrics and Gynecology

## 2017-03-06 DIAGNOSIS — Z1231 Encounter for screening mammogram for malignant neoplasm of breast: Secondary | ICD-10-CM

## 2017-03-08 ENCOUNTER — Encounter: Payer: Self-pay | Admitting: Internal Medicine

## 2017-03-08 DIAGNOSIS — Z01411 Encounter for gynecological examination (general) (routine) with abnormal findings: Secondary | ICD-10-CM | POA: Diagnosis not present

## 2017-03-08 DIAGNOSIS — Z124 Encounter for screening for malignant neoplasm of cervix: Secondary | ICD-10-CM | POA: Diagnosis not present

## 2017-03-08 LAB — HM PAP SMEAR

## 2017-03-23 ENCOUNTER — Ambulatory Visit
Admission: RE | Admit: 2017-03-23 | Discharge: 2017-03-23 | Disposition: A | Discharge status: Home / Self Care | Payer: 59 | Source: Ambulatory Visit | Attending: Obstetrics and Gynecology | Admitting: Obstetrics and Gynecology

## 2017-03-23 DIAGNOSIS — Z1231 Encounter for screening mammogram for malignant neoplasm of breast: Secondary | ICD-10-CM | POA: Diagnosis not present

## 2017-04-27 DIAGNOSIS — K5904 Chronic idiopathic constipation: Secondary | ICD-10-CM | POA: Diagnosis not present

## 2017-04-27 DIAGNOSIS — Z1211 Encounter for screening for malignant neoplasm of colon: Secondary | ICD-10-CM | POA: Diagnosis not present

## 2017-05-10 DIAGNOSIS — Z1211 Encounter for screening for malignant neoplasm of colon: Secondary | ICD-10-CM | POA: Diagnosis not present

## 2017-05-10 DIAGNOSIS — D124 Benign neoplasm of descending colon: Secondary | ICD-10-CM | POA: Diagnosis not present

## 2017-05-10 DIAGNOSIS — K635 Polyp of colon: Secondary | ICD-10-CM | POA: Diagnosis not present

## 2017-05-10 DIAGNOSIS — D122 Benign neoplasm of ascending colon: Secondary | ICD-10-CM | POA: Diagnosis not present

## 2017-05-11 DIAGNOSIS — D122 Benign neoplasm of ascending colon: Secondary | ICD-10-CM | POA: Diagnosis not present

## 2017-05-11 DIAGNOSIS — D124 Benign neoplasm of descending colon: Secondary | ICD-10-CM | POA: Diagnosis not present

## 2017-05-11 DIAGNOSIS — Z1211 Encounter for screening for malignant neoplasm of colon: Secondary | ICD-10-CM | POA: Diagnosis not present

## 2017-05-29 ENCOUNTER — Other Ambulatory Visit: Payer: Self-pay | Admitting: Endocrinology

## 2017-05-30 ENCOUNTER — Other Ambulatory Visit (INDEPENDENT_AMBULATORY_CARE_PROVIDER_SITE_OTHER): Payer: 59

## 2017-05-30 DIAGNOSIS — E041 Nontoxic single thyroid nodule: Secondary | ICD-10-CM | POA: Diagnosis not present

## 2017-05-30 DIAGNOSIS — E876 Hypokalemia: Secondary | ICD-10-CM | POA: Diagnosis not present

## 2017-05-30 LAB — T4, FREE: FREE T4: 1 ng/dL (ref 0.60–1.60)

## 2017-05-30 LAB — BASIC METABOLIC PANEL
BUN: 11 mg/dL (ref 6–23)
CO2: 23 meq/L (ref 19–32)
Calcium: 10.3 mg/dL (ref 8.4–10.5)
Chloride: 109 mEq/L (ref 96–112)
Creatinine, Ser: 0.65 mg/dL (ref 0.40–1.20)
GFR: 121.47 mL/min (ref >60.00)
GLUCOSE: 73 mg/dL (ref 70–99)
POTASSIUM: 3.9 meq/L (ref 3.5–5.1)
SODIUM: 140 meq/L (ref 135–145)

## 2017-05-30 LAB — T3, FREE: T3, Free: 3.4 pg/mL (ref 2.3–4.2)

## 2017-05-30 LAB — TSH: TSH: 0.24 u[IU]/mL — ABNORMAL LOW (ref 0.35–4.50)

## 2017-06-02 ENCOUNTER — Encounter: Payer: Self-pay | Admitting: Endocrinology

## 2017-06-02 ENCOUNTER — Ambulatory Visit (INDEPENDENT_AMBULATORY_CARE_PROVIDER_SITE_OTHER): Payer: 59 | Admitting: Endocrinology

## 2017-06-02 VITALS — BP 110/70 | HR 70 | Ht 66.0 in | Wt 158.2 lb

## 2017-06-02 DIAGNOSIS — R946 Abnormal results of thyroid function studies: Secondary | ICD-10-CM | POA: Diagnosis not present

## 2017-06-02 DIAGNOSIS — Z8639 Personal history of other endocrine, nutritional and metabolic disease: Secondary | ICD-10-CM

## 2017-06-02 DIAGNOSIS — E041 Nontoxic single thyroid nodule: Secondary | ICD-10-CM | POA: Diagnosis not present

## 2017-06-02 DIAGNOSIS — R7989 Other specified abnormal findings of blood chemistry: Secondary | ICD-10-CM

## 2017-06-02 MED ORDER — PHENTERMINE HCL 15 MG PO CAPS: 15.0000 mg | ORAL_CAPSULE | Freq: Every day | ORAL | 5 refills | Status: DC

## 2017-06-02 NOTE — Progress Notes (Signed)
Kathleen Burton is a 56 y.o. female.    Chief complaint: Followup of thyroid and weight management    History of Present Illness:   Problem 1: Weight management  Because of a weight gain of about 15 pounds and difficulty with losing weight with diet and exercise regimen she wanted weight loss medications.  She was asked to try Qsymia but because of lack of coverage she was given the equivalent doses in generic form She continues on phentermine  15 mg daily and Topamax to 50 mg daily with which she had improved satiety and had significant weight loss She has not had any formal dietitian consultation   Overall her weight is about the same as last time Although she has been able to maintain her weight she is not consistent with diet and tends to have stress eating She does still try to keep up with exercise when she can with either walking inside or outside  She does not feel any palpitations taking phentermine; although she was told to take this every other day on the last visit because of relatively high pulse rates she forgot and is taking this every day with her Topamax    Wt Readings from Last 3 Encounters:  06/02/17 158 lb 3.2 oz (71.8 kg)  11/24/16 159 lb 4 oz (72.2 kg)  11/03/16 155 lb 6 oz (70.5 kg)    Problem 2:  Thyroid nodule which was biopsied in 04/2013 and was found to be benign. This measured about 3 cm previously on exam Also Has  had mildly low TSH without high free T4;  although this was normal in January it is low again She has not had a nuclear thyroid scan previously    She feels well overall and has no palpitations or heat intolerance   Lab Results  Component Value Date   TSH 0.24 (L) 05/30/2017   TSH 0.48 11/15/2016   TSH 0.27 (L) 04/08/2016   FREET4 1.00 05/30/2017   FREET4 0.86 11/15/2016   FREET4 0.91 04/08/2016      Allergies as of 06/02/2017      Reactions   Latex Swelling      Medication List       Accurate as of 06/02/17 11:35 AM.  Always use your most recent med list.          multivitamin tablet Take 1 tablet by mouth daily.   phentermine 15 MG capsule Take 1 capsule (15 mg total) by mouth daily.   topiramate 50 MG tablet Commonly known as:  TOPAMAX TAKE 1 TABLET BY MOUTH TWO  TIMES DAILY       Allergies:  Allergies  Allergen Reactions  . Latex Swelling    Past Medical History:  Diagnosis Date  . Anemia   . Dysmenorrhea   . Herpes   . History of kidney stones   . Thyroid nodule    bx 04-2013    Past Surgical History:  Procedure Laterality Date  . BREAST EXCISIONAL BIOPSY Left 2004  . BREAST SURGERY     biospy  . EXTRACORPOREAL SHOCK WAVE LITHOTRIPSY Left 11/03/2016   Procedure: LEFT EXTRACORPOREAL SHOCK WAVE LITHOTRIPSY (ESWL);  Surgeon: Festus Aloe, MD;  Location: [facility];  Service: Urology;  Laterality: Left;  . FOOT SURGERY Bilateral   . NOVASURE ABLATION      Family History  Problem Relation Age of Onset  . Hypertension Mother        Jerilynn Mages- brother   . Leukemia Mother  deceased  . Diabetes Brother   . Breast cancer Maternal Aunt        aunt-cousin  . Hypothyroidism Sister        had thyroid surgery  . Breast cancer Cousin   . Colon cancer Neg Hx   . CAD Neg Hx     Social History:  reports that she has never smoked. She has never used smokeless tobacco. She reports that she does not drink alcohol or use drugs.  Review of Systems     LABS:  Lab on 05/30/2017  Component Date Value Ref Range Status  . Sodium 05/30/2017 140  135 - 145 mEq/L Final  . Potassium 05/30/2017 3.9  3.5 - 5.1 mEq/L Final  . Chloride 05/30/2017 109  96 - 112 mEq/L Final  . CO2 05/30/2017 23  19 - 32 mEq/L Final  . Glucose, Bld 05/30/2017 73  70 - 99 mg/dL Final  . BUN 05/30/2017 11  6 - 23 mg/dL Final  . Creatinine, Ser 05/30/2017 0.65  0.40 - 1.20 mg/dL Final  . Calcium 05/30/2017 10.3  8.4 - 10.5 mg/dL Final  . GFR 05/30/2017 121.47  >60.00 mL/min Final  . TSH 05/30/2017 0.24* 0.35 -  4.50 uIU/mL Final  . T3, Free 05/30/2017 3.4  2.3 - 4.2 pg/mL Final  . Free T4 05/30/2017 1.00  0.60 - 1.60 ng/dL Final   Comment: Specimens from patients who are undergoing biotin therapy and /or ingesting biotin supplements may contain high levels of biotin.  The higher biotin concentration in these specimens interferes with this Free T4 assay.  Specimens that contain high levels  of biotin may cause false high results for this Free T4 assay.  Please interpret results in light of the total clinical presentation of the patient.      EXAM:  BP 110/70   Pulse 70   Ht 5\' 6"  (1.676 m)   Wt 158 lb 3.2 oz (71.8 kg)   SpO2 98%   BMI 25.53 kg/m    There is a thyroid nodule visible in the midline area  Thyroid nodule is about 2.5-2.7 cm, smooth and firm, palpable in the isthmus/left lobe  area.    Right lobe not palpable  No neck lymphadenopathy  Assessment/Plan:   Obesity with baseline BMI 33:  She is on combination of low-dose phentermine  15 mg and Topamax  50  daily   She has been able to maintain her weight loss and He generally trying to be good with exercise regimen She is periodically going off her diet with stress but more recently has been trying to be better compliant with controlling carbohydrates and portions Does not appear to have any adverse effects of the medications and she doesn't want to continue these    THYROID nodule:  asymptomatic Clinically unchanged in size on exam She has a benign nodule proven by biopsy  TSH level is mildly low on this visit With normal free T4    Mild hypokalemia in the past: This is  consistently controlled now with dietary changes  Theo Reither 06/02/2017, 11:35 AM

## 2017-06-04 IMAGING — CT CT ABD-PELV W/O CM
2 of 4 series · 13 of 36 positions shown, 19 images · non-contrast
Comparison: None.

CLINICAL DATA: Hematuria, bilateral kidney stones, nausea

EXAM:
CT ABDOMEN AND PELVIS WITHOUT CONTRAST
TECHNIQUE: Multidetector CT imaging of the abdomen and pelvis was performed
following the standard protocol without IV contrast.

[Series 601: coronal body · coronal · 0.85mm/px · 1 of 112 slices shown, 2 images]
[im 38/112  soft-tissue]
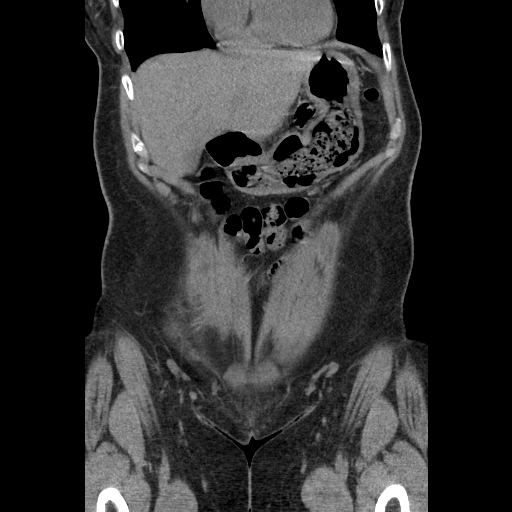
[im 38/112  bone]
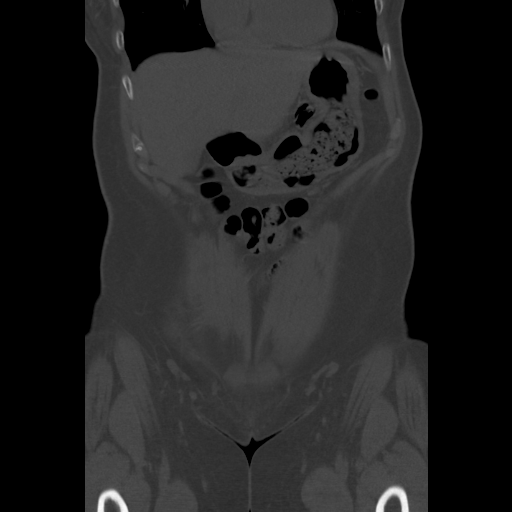

[Series 602: sagittal body · sagittal · 0.85mm/px · 12 of 127 slices shown, 17 images]
[im 8/127  soft-tissue]
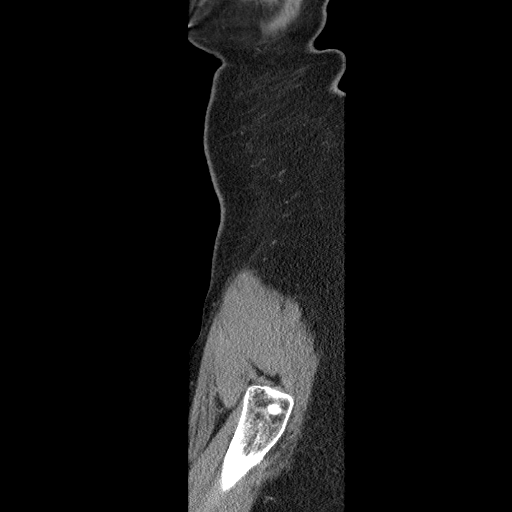
[im 8/127  lung]
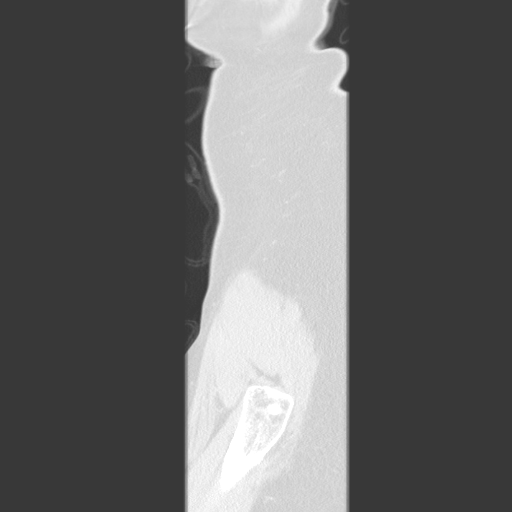
[im 8/127  bone]
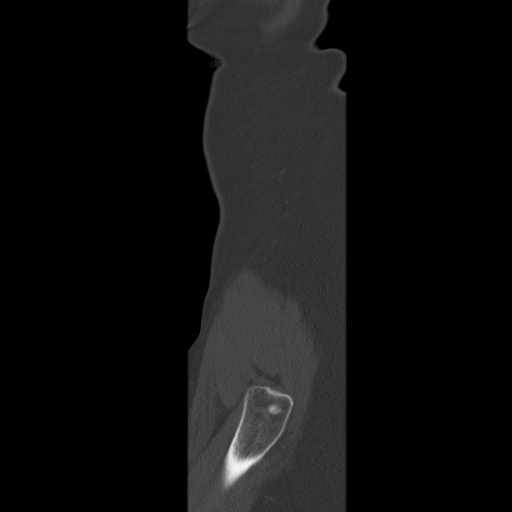
[im 15/127  lung]
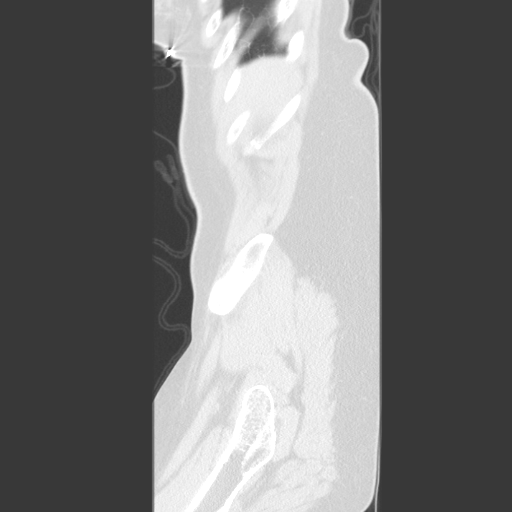
[im 23/127  soft-tissue]
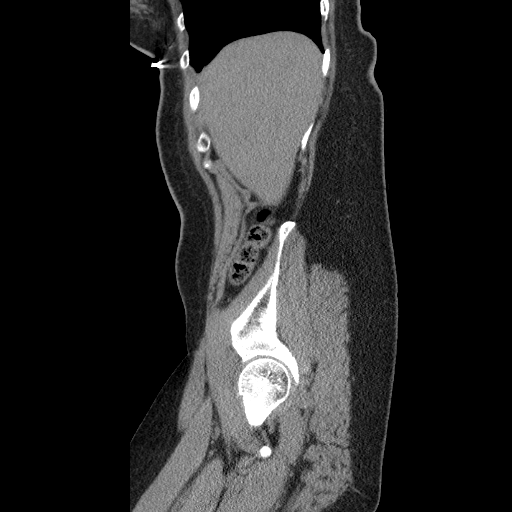
[im 23/127  lung]
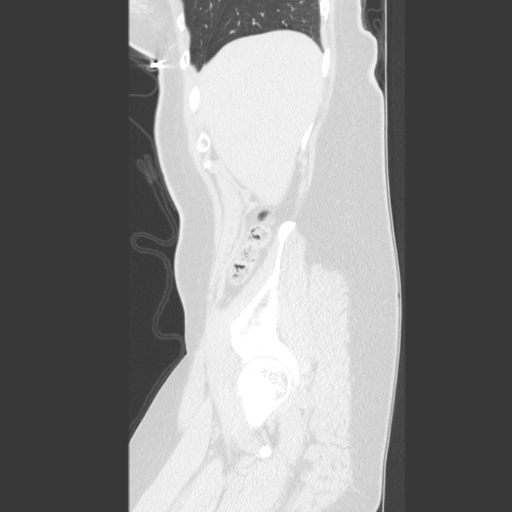
[im 30/127  soft-tissue]
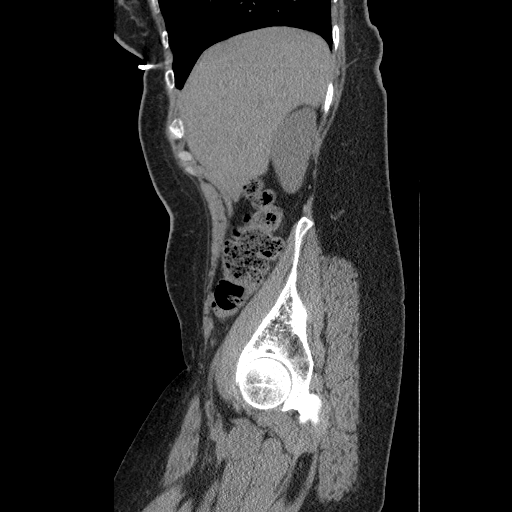
[im 30/127  lung]
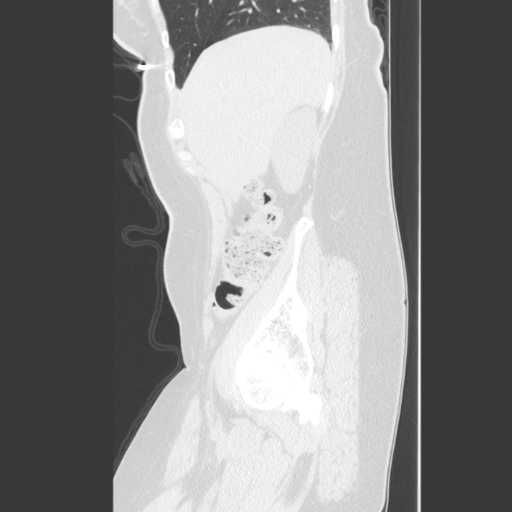
[im 45/127  soft-tissue]
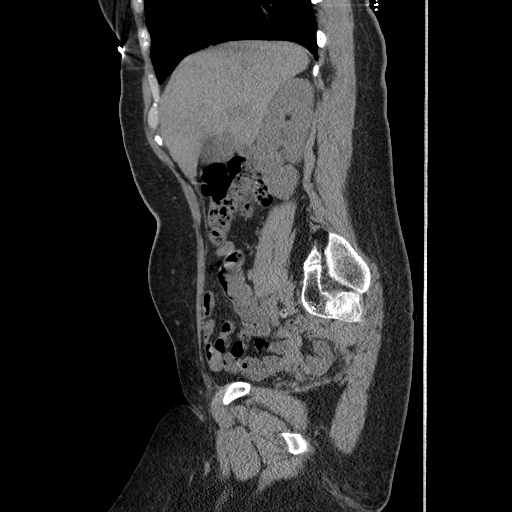
[im 52/127  soft-tissue]
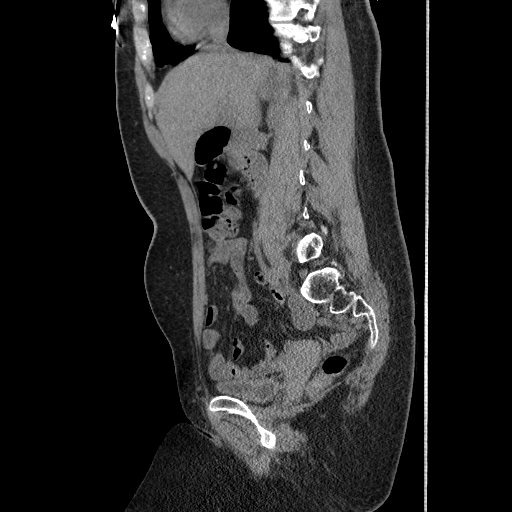
[im 67/127  soft-tissue]
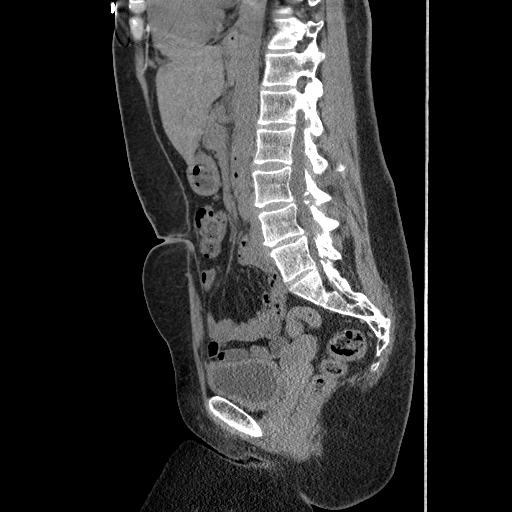
[im 75/127  soft-tissue]
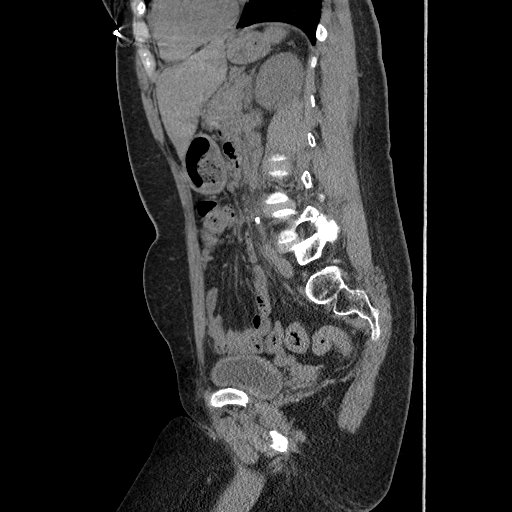
[im 82/127  soft-tissue]
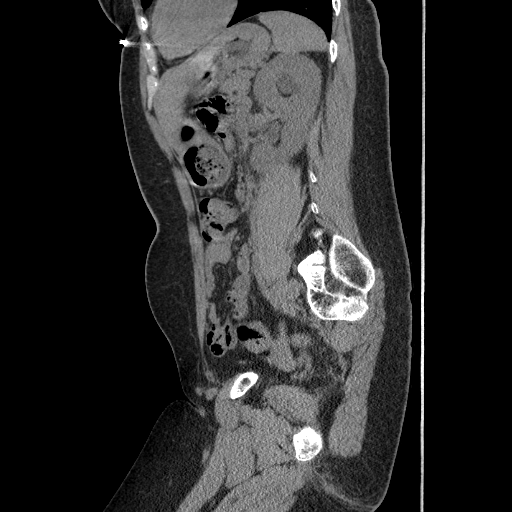
[im 97/127  soft-tissue]
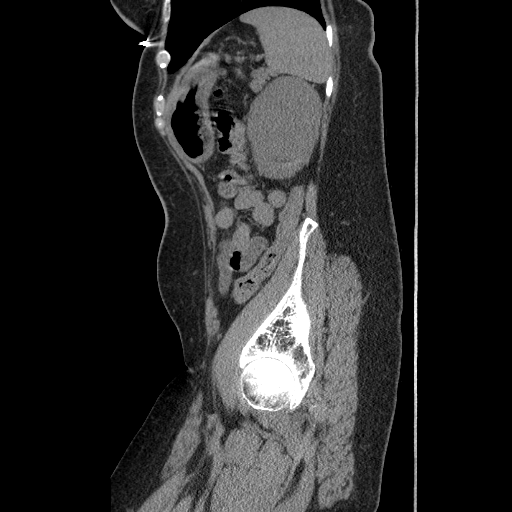
[im 97/127  bone]
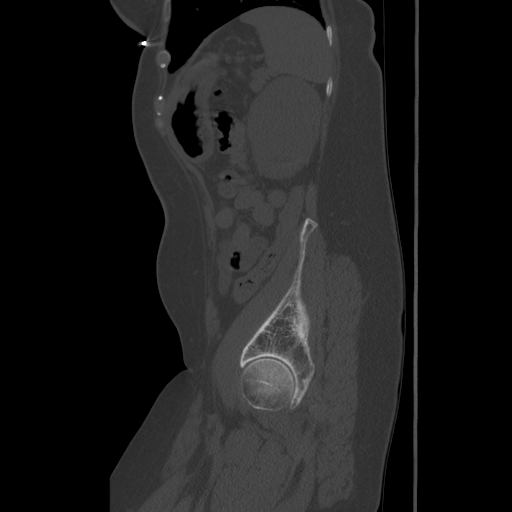
[im 104/127  soft-tissue]
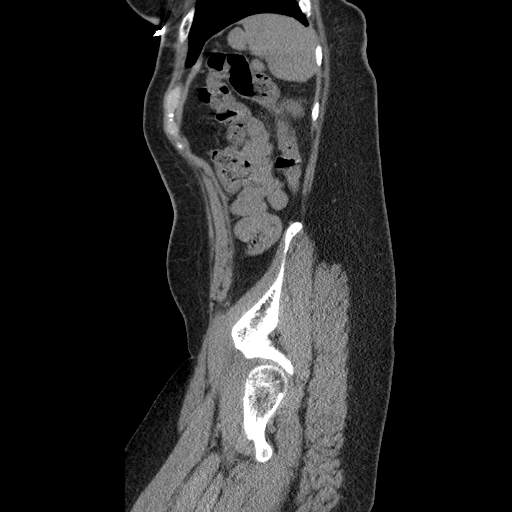
[im 119/127  soft-tissue]
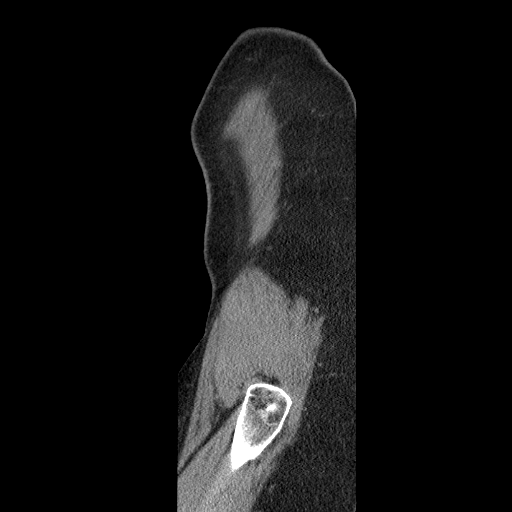

[13 of 36 positions shown; findings below may reference images not displayed]

FINDINGS: Lower chest: Lung bases are clear.

Hepatobiliary: Unenhanced liver is unremarkable.

Gallbladder is within normal limits. No intrahepatic or extrahepatic
ductal dilatation.

Pancreas: Within normal limits.

Spleen: Within normal limits.

Adrenals/Urinary Tract: Adrenal glands are within normal limits.

Three nonobstructing right renal calculi measuring up to 3 mm in the
medial right upper pole (series 3/ image 19). No hydronephrosis.

Numerous left renal calculi measuring up to 5 mm in the left lower
pole (series 3/ image 29).

Mild left hydroureteronephrosis. Associated 6 mm proximal/mid left
ureteral calculus (series 3/ image 36). Additional 7 mm mid left
ureteral calculus (series 3/ image 39).

Bladder is mildly thick-walled although underdistended.

Stomach/Bowel: Stomach is within normal limits.

No evidence bowel obstruction.

Normal appendix (series 3/image 48).

Vascular/Lymphatic: No evidence of abdominal aortic aneurysm.

No suspicious abdominopelvic lymphadenopathy.

Reproductive: Uterus is within normal limits.

Bilateral ovaries are within normal limits.

Other: No abdominopelvic ascites.

Musculoskeletal: Visualized osseous structures are within normal
limits.
IMPRESSION: Two mid left ureteral calculi measuring up to 7 mm. Associated mild
left hydroureteronephrosis.

Multiple bilateral nonobstructing renal calculi measuring up to 5
mm.

## 2017-08-31 ENCOUNTER — Other Ambulatory Visit: Payer: Self-pay | Admitting: Endocrinology

## 2017-11-10 ENCOUNTER — Other Ambulatory Visit: Payer: Self-pay | Admitting: Endocrinology

## 2017-12-05 ENCOUNTER — Other Ambulatory Visit (INDEPENDENT_AMBULATORY_CARE_PROVIDER_SITE_OTHER): Payer: 59

## 2017-12-05 ENCOUNTER — Other Ambulatory Visit: Payer: 59

## 2017-12-05 DIAGNOSIS — Z8639 Personal history of other endocrine, nutritional and metabolic disease: Secondary | ICD-10-CM | POA: Diagnosis not present

## 2017-12-05 DIAGNOSIS — R7989 Other specified abnormal findings of blood chemistry: Secondary | ICD-10-CM

## 2017-12-05 LAB — COMPREHENSIVE METABOLIC PANEL
ALT: 15 U/L (ref 0–35)
AST: 15 U/L (ref 0–37)
Albumin: 4.2 g/dL (ref 3.5–5.2)
Alkaline Phosphatase: 91 U/L (ref 39–117)
BUN: 13 mg/dL (ref 6–23)
CO2: 23 meq/L (ref 19–32)
Calcium: 10.2 mg/dL (ref 8.4–10.5)
Chloride: 108 mEq/L (ref 96–112)
Creatinine, Ser: 0.66 mg/dL (ref 0.40–1.20)
GFR: 119.12 mL/min (ref >60.00)
GLUCOSE: 80 mg/dL (ref 70–99)
POTASSIUM: 3.8 meq/L (ref 3.5–5.1)
Sodium: 136 mEq/L (ref 135–145)
Total Bilirubin: 0.4 mg/dL (ref 0.2–1.2)
Total Protein: 7.9 g/dL (ref 6.0–8.3)

## 2017-12-05 LAB — T3, FREE: T3, Free: 3 pg/mL (ref 2.3–4.2)

## 2017-12-05 LAB — TSH: TSH: 0.22 u[IU]/mL — AB (ref 0.35–4.50)

## 2017-12-05 LAB — T4, FREE: Free T4: 0.87 ng/dL (ref 0.60–1.60)

## 2017-12-08 ENCOUNTER — Ambulatory Visit: Payer: 59 | Admitting: Endocrinology

## 2017-12-08 ENCOUNTER — Encounter: Payer: Self-pay | Admitting: Endocrinology

## 2017-12-08 ENCOUNTER — Other Ambulatory Visit: Payer: Self-pay | Admitting: Endocrinology

## 2017-12-08 VITALS — BP 122/64 | HR 84 | Ht 66.0 in | Wt 161.2 lb

## 2017-12-08 DIAGNOSIS — E041 Nontoxic single thyroid nodule: Secondary | ICD-10-CM

## 2017-12-08 MED ORDER — PHENTERMINE HCL 15 MG PO CAPS: 15.0000 mg | ORAL_CAPSULE | Freq: Every day | ORAL | 5 refills | Status: DC

## 2017-12-08 NOTE — Progress Notes (Signed)
Kathleen Burton is a 57 y.o. female.    Chief complaint: Followup of thyroid and weight management    History of Present Illness:   Problem 1: Weight management  Because of a weight gain of about 15 pounds and difficulty with losing weight with diet and exercise regimen she wanted weight loss medications.  She was asked to try Qsymia but because of lack of coverage she was given the equivalent doses in generic form She continues on phentermine  15 mg daily and Topamax to 50 mg daily with which she had improved satiety and had significant weight loss She has not had any formal dietitian consultation  Reports that she is recently again not consistent with diet and tends to have stress eating She said that she has had a lot of stress with family issues  She also says that she has not had a chance to exercise even though previously was doing so  She does sometimes have a faster pulse rate but does not feel any palpitations since taking phentermine low-dose Not having any side effects with 50 mg Topamax, currently taking this once a day  Her weight is up 3 pounds since her last visit in August   Wt Readings from Last 3 Encounters:  12/08/17 161 lb 3.2 oz (73.1 kg)  06/02/17 158 lb 3.2 oz (71.8 kg)  11/24/16 159 lb 4 oz (72.2 kg)    Problem 2:  Thyroid nodule mostly on the left side which was biopsied in 04/2013 and was found to be benign. This measured about 3 cm previously on exam  Also persistently slightly low TSH without high free T4  She has not had a nuclear thyroid scan previously    She feels well overall and has no palpitations or heat intolerance   Lab Results  Component Value Date   TSH 0.22 (L) 12/05/2017   TSH 0.24 (L) 05/30/2017   TSH 0.48 11/15/2016   FREET4 0.87 12/05/2017   FREET4 1.00 05/30/2017   FREET4 0.86 11/15/2016      Allergies as of 12/08/2017      Reactions   Latex Swelling      Medication List        Accurate as of 12/08/17  3:19 PM.  Always use your most recent med list.          multivitamin tablet Take 1 tablet by mouth daily.   phentermine 15 MG capsule Take 1 capsule (15 mg total) by mouth daily.   topiramate 50 MG tablet Commonly known as:  TOPAMAX TAKE 1 TABLET BY MOUTH TWO  TIMES DAILY       Allergies:  Allergies  Allergen Reactions  . Latex Swelling    Past Medical History:  Diagnosis Date  . Anemia   . Dysmenorrhea   . Herpes   . History of kidney stones   . Thyroid nodule    bx 04-2013    Past Surgical History:  Procedure Laterality Date  . BREAST EXCISIONAL BIOPSY Left 2004  . BREAST SURGERY     biospy  . EXTRACORPOREAL SHOCK WAVE LITHOTRIPSY Left 11/03/2016   Procedure: LEFT EXTRACORPOREAL SHOCK WAVE LITHOTRIPSY (ESWL);  Surgeon: Festus Aloe, MD;  Location: [facility];  Service: Urology;  Laterality: Left;  . FOOT SURGERY Bilateral   . NOVASURE ABLATION      Family History  Problem Relation Age of Onset  . Hypertension Mother        Kathleen Burton- brother   . Leukemia Mother  deceased  . Diabetes Brother   . Breast cancer Maternal Aunt        aunt-cousin  . Hypothyroidism Sister        had thyroid surgery  . Breast cancer Cousin   . Colon cancer Neg Hx   . CAD Neg Hx     Social History:  reports that  has never smoked. she has never used smokeless tobacco. She reports that she does not drink alcohol or use drugs.  Review of Systems     LABS:  Lab on 12/05/2017  Component Date Value Ref Range Status  . Sodium 12/05/2017 136  135 - 145 mEq/L Final  . Potassium 12/05/2017 3.8  3.5 - 5.1 mEq/L Final  . Chloride 12/05/2017 108  96 - 112 mEq/L Final  . CO2 12/05/2017 23  19 - 32 mEq/L Final  . Glucose, Bld 12/05/2017 80  70 - 99 mg/dL Final  . BUN 12/05/2017 13  6 - 23 mg/dL Final  . Creatinine, Ser 12/05/2017 0.66  0.40 - 1.20 mg/dL Final  . Total Bilirubin 12/05/2017 0.4  0.2 - 1.2 mg/dL Final  . Alkaline Phosphatase 12/05/2017 91  39 - 117 U/L Final  . AST  12/05/2017 15  0 - 37 U/L Final  . ALT 12/05/2017 15  0 - 35 U/L Final  . Total Protein 12/05/2017 7.9  6.0 - 8.3 g/dL Final  . Albumin 12/05/2017 4.2  3.5 - 5.2 g/dL Final  . Calcium 12/05/2017 10.2  8.4 - 10.5 mg/dL Final  . GFR 12/05/2017 119.12  >60.00 mL/min Final  . T3, Free 12/05/2017 3.0  2.3 - 4.2 pg/mL Final  . Free T4 12/05/2017 0.87  0.60 - 1.60 ng/dL Final   Comment: Specimens from patients who are undergoing biotin therapy and /or ingesting biotin supplements may contain high levels of biotin.  The higher biotin concentration in these specimens interferes with this Free T4 assay.  Specimens that contain high levels  of biotin may cause false high results for this Free T4 assay.  Please interpret results in light of the total clinical presentation of the patient.    Marland Kitchen TSH 12/05/2017 0.22* 0.35 - 4.50 uIU/mL Final    EXAM:  BP 122/64   Pulse 84   Ht 5\' 6"  (1.676 m)   Wt 161 lb 3.2 oz (73.1 kg)   BMI 26.02 kg/m    There is a thyroid nodule visible in the midline area  Thyroid nodule is about 2.5-2.7 cm, smooth and firm, palpable in the isthmus/left lobe  area.    Right lobe not palpable  No neck lymphadenopathy  Assessment/Plan:   Obesity with baseline BMI 33:  She is on combination of low-dose phentermine  15 mg and Topamax  50  daily   She has gained 3 pounds Again she is not watching her diet because of stress eating She thinks she has been not able to focus on getting exercise started  She will continue the same regimen but encouraged her to start exercising which should help with her stress also   THYROID nodule:  asymptomatic Clinically unchanged in size on exam She has a benign nodule proven by biopsy Reassured her that there is only minimal change in size over the last few years and this is not indicative of a new diagnosis and we will continue to watch her clinically  TSH level is mildly low again normal free T4 likely indicating that she has a warm  nodule, no indication for  a scan at this time   Mild hypokalemia in the past: This is  consistently back to normal now with dietary changes  Kathleen Burton 12/08/2017, 3:19 PM

## 2017-12-08 NOTE — Patient Instructions (Signed)
Restart exercise ?

## 2018-02-14 ENCOUNTER — Other Ambulatory Visit: Payer: Self-pay | Admitting: Obstetrics and Gynecology

## 2018-02-14 DIAGNOSIS — Z1231 Encounter for screening mammogram for malignant neoplasm of breast: Secondary | ICD-10-CM

## 2018-02-26 ENCOUNTER — Other Ambulatory Visit: Payer: Self-pay | Admitting: Endocrinology

## 2018-03-14 DIAGNOSIS — Z01411 Encounter for gynecological examination (general) (routine) with abnormal findings: Secondary | ICD-10-CM | POA: Diagnosis not present

## 2018-03-14 DIAGNOSIS — Z124 Encounter for screening for malignant neoplasm of cervix: Secondary | ICD-10-CM | POA: Diagnosis not present

## 2018-03-14 LAB — HM PAP SMEAR

## 2018-03-20 ENCOUNTER — Encounter: Payer: Self-pay | Admitting: Internal Medicine

## 2018-03-26 ENCOUNTER — Ambulatory Visit
Admission: RE | Admit: 2018-03-26 | Discharge: 2018-03-26 | Disposition: A | Discharge status: Home / Self Care | Payer: 59 | Source: Ambulatory Visit | Attending: Obstetrics and Gynecology | Admitting: Obstetrics and Gynecology

## 2018-03-26 DIAGNOSIS — Z1231 Encounter for screening mammogram for malignant neoplasm of breast: Secondary | ICD-10-CM

## 2018-06-05 ENCOUNTER — Other Ambulatory Visit (INDEPENDENT_AMBULATORY_CARE_PROVIDER_SITE_OTHER): Payer: 59

## 2018-06-05 DIAGNOSIS — E041 Nontoxic single thyroid nodule: Secondary | ICD-10-CM | POA: Diagnosis not present

## 2018-06-05 LAB — T4, FREE: Free T4: 0.74 ng/dL (ref 0.60–1.60)

## 2018-06-05 LAB — TSH: TSH: 0.26 u[IU]/mL — ABNORMAL LOW (ref 0.35–4.50)

## 2018-06-05 LAB — T3, FREE: T3 FREE: 3.7 pg/mL (ref 2.3–4.2)

## 2018-06-08 ENCOUNTER — Ambulatory Visit: Payer: 59 | Admitting: Endocrinology

## 2018-06-10 NOTE — Progress Notes (Signed)
Kathleen Burton is a 57 y.o. female.    Chief complaint: Followup of thyroid and weight management    History of Present Illness:   Problem 1: Weight management  Because of a weight gain of about 15 pounds and difficulty with losing weight with diet and exercise regimen she wanted weight loss medications.  She was asked to try Qsymia but because of lack of coverage she was given the equivalent doses in generic form She continues on phentermine  15 mg daily and Topamax to 50 mg daily with which she had improved satiety and had significant weight loss She has not had any formal dietitian consultation  Recent history: Again she has had a lot of stress with family issues and also family member last month She thinks that has been eating poorly with not being at home and planning her meals  She also says that she has not been able to find the time for exercise for various reasons also  Not having any side effects with 50 mg Topamax, recently taking this twice a day since she was not doing well on the diet, previously was doing well with once a day.  She does not get excessively hungry with phentermine and Topamax together  Her weight is lower than 6 months ago   Wt Readings from Last 3 Encounters:  06/11/18 155 lb 9.6 oz (70.6 kg)  12/08/17 161 lb 3.2 oz (73.1 kg)  06/02/17 158 lb 3.2 oz (71.8 kg)    Problem 2:  Thyroid nodule on the left side which was biopsied in 04/2013 and was found to be benign. This measured about 3 cm previously on exam  Also persistently slightly low TSH without high free T4 or free T3 She has not had a nuclear thyroid scan     She feels well overall and has no palpitations or heat intolerance   Lab Results  Component Value Date   TSH 0.26 (L) 06/05/2018   TSH 0.22 (L) 12/05/2017   TSH 0.24 (L) 05/30/2017   FREET4 0.74 06/05/2018   FREET4 0.87 12/05/2017   FREET4 1.00 05/30/2017      Allergies as of 06/11/2018      Reactions   Latex Swelling        Medication List        Accurate as of 06/11/18 10:52 AM. Always use your most recent med list.          multivitamin tablet Take 1 tablet by mouth daily.   phentermine 15 MG capsule Take 1 capsule (15 mg total) by mouth daily.   topiramate 50 MG tablet Commonly known as:  TOPAMAX TAKE 1 TABLET BY MOUTH TWO  TIMES DAILY       Allergies:  Allergies  Allergen Reactions  . Latex Swelling    Past Medical History:  Diagnosis Date  . Anemia   . Dysmenorrhea   . Herpes   . History of kidney stones   . Thyroid nodule    bx 04-2013    Past Surgical History:  Procedure Laterality Date  . BREAST EXCISIONAL BIOPSY Left 2004  . BREAST SURGERY     biospy  . EXTRACORPOREAL SHOCK WAVE LITHOTRIPSY Left 11/03/2016   Procedure: LEFT EXTRACORPOREAL SHOCK WAVE LITHOTRIPSY (ESWL);  Surgeon: Festus Aloe, MD;  Location: [facility];  Service: Urology;  Laterality: Left;  . FOOT SURGERY Bilateral   . NOVASURE ABLATION      Family History  Problem Relation Age of Onset  . Hypertension Mother  M- brother   . Leukemia Mother        deceased  . Diabetes Brother   . Breast cancer Maternal Aunt        aunt-cousin  . Hypothyroidism Sister        had thyroid surgery  . Breast cancer Cousin   . Colon cancer Neg Hx   . CAD Neg Hx     Social History:  reports that she has never smoked. She has never used smokeless tobacco. She reports that she does not drink alcohol or use drugs.  Review of Systems     LABS:  Lab on 06/05/2018  Component Date Value Ref Range Status  . T3, Free 06/05/2018 3.7  2.3 - 4.2 pg/mL Final  . Free T4 06/05/2018 0.74  0.60 - 1.60 ng/dL Final   Comment: Specimens from patients who are undergoing biotin therapy and /or ingesting biotin supplements may contain high levels of biotin.  The higher biotin concentration in these specimens interferes with this Free T4 assay.  Specimens that contain high levels  of biotin may cause false high results  for this Free T4 assay.  Please interpret results in light of the total clinical presentation of the patient.    Marland Kitchen TSH 06/05/2018 0.26* 0.35 - 4.50 uIU/mL Final    EXAM:  BP 124/72 (BP Location: Left Arm, Patient Position: Sitting, Cuff Size: Normal)   Pulse 67   Ht 5\' 6"  (1.676 m)   Wt 155 lb 9.6 oz (70.6 kg)   BMI 25.11 kg/m    She has a thyroid nodule visible in the midline area  Thyroid nodule is nearly 3 cm across horizontally in the isthmus Right medial lobe is just palpable and soft Left lobe not palpable  No neck lymphadenopathy  Assessment/Plan:   Obesity with baseline BMI 33:  She is on combination of low-dose phentermine  15 mg and Topamax  50  daily   Recently she is not watching her diet because of stress eating and more so in the last month or so Also has not been able to find time to exercise  Surprisingly has lost weight and this may be from stress and loss of family member  She will continue the same regimen including phentermine that has been working safely  She will need to start exercising regularly and follow-up in 6 months  THYROID nodule with slightly low TSH:  asymptomatic Clinically unchanged in size on exam She has a benign nodule proven by biopsy  Again her labs are about the same including free T3 level which is normal Discussed possible symptoms of hyperthyroidism if she develops toxic nodular goiter  [name] 06/11/2018, 10:52 AM

## 2018-06-11 ENCOUNTER — Ambulatory Visit: Payer: 59 | Admitting: Endocrinology

## 2018-06-11 ENCOUNTER — Encounter: Payer: Self-pay | Admitting: Endocrinology

## 2018-06-11 VITALS — BP 124/72 | HR 67 | Ht 66.0 in | Wt 155.6 lb

## 2018-06-11 DIAGNOSIS — Z8639 Personal history of other endocrine, nutritional and metabolic disease: Secondary | ICD-10-CM | POA: Diagnosis not present

## 2018-06-11 DIAGNOSIS — E041 Nontoxic single thyroid nodule: Secondary | ICD-10-CM | POA: Diagnosis not present

## 2018-06-11 MED ORDER — PHENTERMINE HCL 15 MG PO CAPS: 15.0000 mg | ORAL_CAPSULE | Freq: Every day | ORAL | 5 refills | Status: DC

## 2018-06-11 MED ORDER — TOPIRAMATE 50 MG PO TABS: 50.0000 mg | ORAL_TABLET | Freq: Two times a day (BID) | ORAL | 1 refills | Status: DC

## 2018-11-02 ENCOUNTER — Other Ambulatory Visit: Payer: Self-pay

## 2018-11-02 MED ORDER — TOPIRAMATE 50 MG PO TABS: 50.0000 mg | ORAL_TABLET | Freq: Two times a day (BID) | ORAL | 1 refills | Status: DC

## 2018-12-12 ENCOUNTER — Other Ambulatory Visit (INDEPENDENT_AMBULATORY_CARE_PROVIDER_SITE_OTHER): Payer: 59

## 2018-12-12 DIAGNOSIS — Z8639 Personal history of other endocrine, nutritional and metabolic disease: Secondary | ICD-10-CM

## 2018-12-12 DIAGNOSIS — E041 Nontoxic single thyroid nodule: Secondary | ICD-10-CM

## 2018-12-12 LAB — BASIC METABOLIC PANEL
BUN: 9 mg/dL (ref 6–23)
CALCIUM: 10 mg/dL (ref 8.4–10.5)
CO2: 21 meq/L (ref 19–32)
Chloride: 110 mEq/L (ref 96–112)
Creatinine, Ser: 0.63 mg/dL (ref 0.40–1.20)
GFR: 117.83 mL/min (ref >60.00)
GLUCOSE: 67 mg/dL — AB (ref 70–99)
Potassium: 3.7 mEq/L (ref 3.5–5.1)
SODIUM: 140 meq/L (ref 135–145)

## 2018-12-13 ENCOUNTER — Ambulatory Visit: Payer: 59 | Admitting: Internal Medicine

## 2018-12-13 LAB — TSH: TSH: 0.25 u[IU]/mL — ABNORMAL LOW (ref 0.35–4.50)

## 2018-12-13 LAB — T3, FREE: T3 FREE: 3.4 pg/mL (ref 2.3–4.2)

## 2018-12-13 LAB — T4, FREE: Free T4: 0.86 ng/dL (ref 0.60–1.60)

## 2018-12-14 ENCOUNTER — Other Ambulatory Visit: Payer: Self-pay

## 2018-12-14 ENCOUNTER — Encounter: Payer: Self-pay | Admitting: Endocrinology

## 2018-12-14 ENCOUNTER — Telehealth: Payer: Self-pay | Admitting: Endocrinology

## 2018-12-14 ENCOUNTER — Ambulatory Visit: Payer: 59 | Admitting: Endocrinology

## 2018-12-14 VITALS — BP 122/70 | HR 70 | Ht 66.0 in | Wt 160.0 lb

## 2018-12-14 DIAGNOSIS — Z13220 Encounter for screening for lipoid disorders: Secondary | ICD-10-CM

## 2018-12-14 DIAGNOSIS — R7989 Other specified abnormal findings of blood chemistry: Secondary | ICD-10-CM

## 2018-12-14 DIAGNOSIS — E041 Nontoxic single thyroid nodule: Secondary | ICD-10-CM | POA: Diagnosis not present

## 2018-12-14 MED ORDER — PHENTERMINE HCL 15 MG PO CAPS: 15.0000 mg | ORAL_CAPSULE | Freq: Every day | ORAL | 5 refills | Status: DC

## 2018-12-14 NOTE — Telephone Encounter (Signed)
Noted  

## 2018-12-14 NOTE — Patient Instructions (Signed)
EXERCISE daily

## 2018-12-14 NOTE — Telephone Encounter (Signed)
Were you going to send this?

## 2018-12-14 NOTE — Telephone Encounter (Signed)
Yes I am sending later today

## 2018-12-14 NOTE — Telephone Encounter (Signed)
Patient states her phentermine 15 MG capsule was not sent to [facility].  Please Advise, Thanks

## 2018-12-14 NOTE — Progress Notes (Signed)
Kathleen Burton 58 y.o.   Chief complaint: Followup of thyroid and weight management    History of Present Illness:   Problem 1: Weight management  Because of a weight gain of about 15 pounds and difficulty with losing weight with diet and exercise regimen she wanted weight loss medications.  She was asked to try Qsymia but because of lack of coverage she was given the equivalent doses in generic form She continues on phentermine  15 mg daily and Topamax  50 mg daily with which she had improved satiety and had significant weight loss She has not had any formal dietitian consultation  Recent history: Again she has had a lot of stress with family issues and states that she would not be able to watch her diet because of continued stress She likes to eat sweets to relieve her stress and does not think she can change her habits.  Usually eating cake and ice cream Otherwise she is trying to eat healthy and her weight gain has been limited to only about 5 pounds She has not been motivated to exercise as before  She now says that she was taking only 25 mg Topamax daily and about a week or so ago she decided to increase it to 3 pills a day on her own  Not having any side effects with 75 mg Topamax, recently taking 2 tablets in the morning and 1 in the evening   She does not get excessively hungry with phentermine and Topamax together and wants to continue  Her weight is up 5 pounds compared to 6 months ago   Wt Readings from Last 3 Encounters:  12/14/18 160 lb (72.6 kg)  06/11/18 155 lb 9.6 oz (70.6 kg)  12/08/17 161 lb 3.2 oz (73.1 kg)    Problem 2:  Thyroid nodule on the left side which was biopsied in 04/2013 and was found to be benign. This measured about 3 cm previously on exam  Also persistently slightly low TSH without high free T4 or free T3 She has not had a nuclear thyroid scan     She feels well overall and has no palpitations or heat intolerance   Lab  Results  Component Value Date   TSH 0.25 (L) 12/12/2018   TSH 0.26 (L) 06/05/2018   TSH 0.22 (L) 12/05/2017   FREET4 0.86 12/12/2018   FREET4 0.74 06/05/2018   FREET4 0.87 12/05/2017     Allergies as of 12/14/2018      Reactions   Latex Swelling      Medication List       Accurate as of December 14, 2018 11:03 AM. Always use your most recent med list.        multivitamin tablet Take 1 tablet by mouth daily.   phentermine 15 MG capsule Take 1 capsule (15 mg total) by mouth daily.   topiramate 50 MG tablet Commonly known as:  TOPAMAX Take 1 tablet (50 mg total) by mouth 2 (two) times daily.       Allergies:  Allergies  Allergen Reactions  . Latex Swelling    Past Medical History:  Diagnosis Date  . Anemia   . Dysmenorrhea   . Herpes   . History of kidney stones   . Thyroid nodule    bx 04-2013    Past Surgical History:  Procedure Laterality Date  . BREAST EXCISIONAL BIOPSY Left 2004  . BREAST SURGERY     biospy  . EXTRACORPOREAL SHOCK WAVE LITHOTRIPSY  Left 11/03/2016   Procedure: LEFT EXTRACORPOREAL SHOCK WAVE LITHOTRIPSY (ESWL);  Surgeon: Festus Aloe, MD;  Location: [facility];  Service: Urology;  Laterality: Left;  . FOOT SURGERY Bilateral   . NOVASURE ABLATION      Family History  Problem Relation Age of Onset  . Hypertension Mother        Jerilynn Mages- brother   . Leukemia Mother        deceased  . Diabetes Brother   . Breast cancer Maternal Aunt        aunt-cousin  . Hypothyroidism Sister        had thyroid surgery  . Breast cancer Cousin   . Colon cancer Neg Hx   . CAD Neg Hx     Social History:  reports that she has never smoked. She has never used smokeless tobacco. She reports that she does not drink alcohol or use drugs.  Review of Systems     LABS:  Lab on 12/12/2018  Component Date Value Ref Range Status  . T3, Free 12/12/2018 3.4  2.3 - 4.2 pg/mL Final  . Free T4 12/12/2018 0.86  0.60 - 1.60 ng/dL Final   Comment: Specimens from  patients who are undergoing biotin therapy and /or ingesting biotin supplements may contain high levels of biotin.  The higher biotin concentration in these specimens interferes with this Free T4 assay.  Specimens that contain high levels  of biotin may cause false high results for this Free T4 assay.  Please interpret results in light of the total clinical presentation of the patient.    Marland Kitchen TSH 12/12/2018 0.25* 0.35 - 4.50 uIU/mL Final  . Sodium 12/12/2018 140  135 - 145 mEq/L Final  . Potassium 12/12/2018 3.7  3.5 - 5.1 mEq/L Final  . Chloride 12/12/2018 110  96 - 112 mEq/L Final  . CO2 12/12/2018 21  19 - 32 mEq/L Final  . Glucose, Bld 12/12/2018 67* 70 - 99 mg/dL Final  . BUN 12/12/2018 9  6 - 23 mg/dL Final  . Creatinine, Ser 12/12/2018 0.63  0.40 - 1.20 mg/dL Final  . Calcium 12/12/2018 10.0  8.4 - 10.5 mg/dL Final  . GFR 12/12/2018 117.83  >60.00 mL/min Final    EXAM:  BP 122/70 (BP Location: Left Arm, Patient Position: Sitting, Cuff Size: Normal)   Pulse 70   Ht 5\' 6"  (1.676 m)   Wt 160 lb (72.6 kg)   SpO2 97%   BMI 25.82 kg/m    Thyroid nodule is about 3 cm and mostly in the midline, better felt on swallowing  Right medial lobe is just palpable and soft Left lobe not palpable  No neck lymphadenopathy  Assessment/Plan:   Obesity with baseline BMI 33:  She is on combination of low-dose phentermine  15 mg and Topamax    Again she is not watching her diet consistently because of stress eating Has gained 5 pounds and is not exercising also  She does think that she can try to do better now and feels like she can benefit from a higher dose of Topamax which she is taking more of on her own recently Also she thinks she can try to start some exercise regimen now We will give her a prescription for 75 mg of Topamax daily  Discussed that she should talk to her PCP regarding management of her stress with either counseling or antidepressants but she is reluctant to do  so  THYROID nodule with persistently slightly low TSH: No change  in her nodule on exam Thyroid levels are about the same She has a benign nodule proven by biopsy  We will continue to follow every 6 months  Chayanne Speir 12/14/2018, 11:03 AM

## 2019-02-19 ENCOUNTER — Other Ambulatory Visit: Payer: Self-pay | Admitting: Obstetrics and Gynecology

## 2019-02-19 DIAGNOSIS — Z1231 Encounter for screening mammogram for malignant neoplasm of breast: Secondary | ICD-10-CM

## 2019-02-26 ENCOUNTER — Other Ambulatory Visit: Payer: Self-pay | Admitting: Endocrinology

## 2019-03-03 ENCOUNTER — Other Ambulatory Visit: Payer: Self-pay | Admitting: Endocrinology

## 2019-03-04 NOTE — Telephone Encounter (Signed)
Please advise if refill is appropriate 

## 2019-03-04 NOTE — Telephone Encounter (Signed)
Please link to the proper chart, this is not the actual patient's chart

## 2019-03-05 ENCOUNTER — Other Ambulatory Visit: Payer: Self-pay | Admitting: Endocrinology

## 2019-03-05 MED ORDER — TOPIRAMATE 50 MG PO TABS: ORAL_TABLET | ORAL | 1 refills | Status: DC

## 2019-03-20 LAB — HM PAP SMEAR

## 2019-03-29 ENCOUNTER — Encounter: Payer: Self-pay | Admitting: Internal Medicine

## 2019-04-15 ENCOUNTER — Ambulatory Visit
Admission: RE | Admit: 2019-04-15 | Discharge: 2019-04-15 | Disposition: A | Discharge status: Home / Self Care | Payer: 59 | Source: Ambulatory Visit | Attending: Obstetrics and Gynecology | Admitting: Obstetrics and Gynecology

## 2019-04-15 ENCOUNTER — Other Ambulatory Visit: Payer: Self-pay

## 2019-04-15 DIAGNOSIS — Z1231 Encounter for screening mammogram for malignant neoplasm of breast: Secondary | ICD-10-CM

## 2019-05-17 ENCOUNTER — Encounter: Payer: Self-pay | Admitting: Internal Medicine

## 2019-06-12 ENCOUNTER — Other Ambulatory Visit: Payer: Self-pay

## 2019-06-12 ENCOUNTER — Other Ambulatory Visit (INDEPENDENT_AMBULATORY_CARE_PROVIDER_SITE_OTHER): Payer: 59

## 2019-06-12 DIAGNOSIS — Z13220 Encounter for screening for lipoid disorders: Secondary | ICD-10-CM

## 2019-06-12 DIAGNOSIS — R7989 Other specified abnormal findings of blood chemistry: Secondary | ICD-10-CM | POA: Diagnosis not present

## 2019-06-12 DIAGNOSIS — E041 Nontoxic single thyroid nodule: Secondary | ICD-10-CM | POA: Diagnosis not present

## 2019-06-12 LAB — LIPID PANEL
Cholesterol: 180 mg/dL (ref 0–200)
HDL: 78.1 mg/dL (ref >39.00)
LDL Cholesterol: 90 mg/dL (ref 0–99)
NonHDL: 101.57
Total CHOL/HDL Ratio: 2
Triglycerides: 56 mg/dL (ref 0.0–149.0)
VLDL: 11.2 mg/dL (ref 0.0–40.0)

## 2019-06-12 LAB — T3, FREE: T3, Free: 2.9 pg/mL (ref 2.3–4.2)

## 2019-06-12 LAB — T4, FREE: Free T4: 0.95 ng/dL (ref 0.60–1.60)

## 2019-06-12 LAB — TSH: TSH: 0.16 u[IU]/mL — ABNORMAL LOW (ref 0.35–4.50)

## 2019-06-14 ENCOUNTER — Encounter: Payer: 59 | Admitting: Endocrinology

## 2019-06-14 ENCOUNTER — Other Ambulatory Visit: Payer: Self-pay

## 2019-06-14 NOTE — Progress Notes (Signed)
This encounter was created in error - please disregard.

## 2019-06-17 ENCOUNTER — Other Ambulatory Visit: Payer: Self-pay

## 2019-06-17 ENCOUNTER — Ambulatory Visit: Payer: 59 | Admitting: Endocrinology

## 2019-06-17 ENCOUNTER — Encounter: Payer: Self-pay | Admitting: Endocrinology

## 2019-06-17 VITALS — BP 142/70 | HR 68 | Ht 66.0 in | Wt 161.4 lb

## 2019-06-17 DIAGNOSIS — Z8639 Personal history of other endocrine, nutritional and metabolic disease: Secondary | ICD-10-CM

## 2019-06-17 DIAGNOSIS — E041 Nontoxic single thyroid nodule: Secondary | ICD-10-CM

## 2019-06-17 MED ORDER — PHENTERMINE HCL 15 MG PO CAPS: 15.0000 mg | ORAL_CAPSULE | Freq: Every day | ORAL | 5 refills | Status: DC

## 2019-06-17 MED ORDER — TOPIRAMATE 50 MG PO TABS: ORAL_TABLET | ORAL | 1 refills | Status: DC

## 2019-06-17 NOTE — Progress Notes (Signed)
Kathleen Burton 58 y.o.   Chief complaint: Followup of thyroid and weight management    History of Present Illness:   Problem 1: Weight management  Because of a weight gain of about 15 pounds and difficulty with losing weight with diet and exercise regimen she wanted weight loss medications.  She was asked to try Qsymia but because of lack of coverage she was given the equivalent doses in generic form She continues on phentermine  15 mg daily and Topamax  50 mg daily with which she had improved satiety and had significant weight loss She has not had any formal dietitian consultation  Recent history: Again she has had a lot of stress, now with work related issues However her weight is about the same She likes to eat sweets to relieve her stress and does not think she can change her habits. She has not been motivated to exercise or find the time to do so  She wanted to increase her Topamax to help her with food cravings and she thinks that taking 2 tablets of 50 mg in the morning and 1 in the evening helps her better than smaller doses She is asking about increasing her phentermine She needs a new prescription for this   Wt Readings from Last 3 Encounters:  06/17/19 161 lb 6.4 oz (73.2 kg)  12/14/18 160 lb (72.6 kg)  06/11/18 155 lb 9.6 oz (70.6 kg)    Problem 2:  Thyroid nodule on the left side which was biopsied in 04/2013 and was found to be benign.  This has measured about 3 cm consistently on previous exams  She has a persistently low TSH without high free T4 or free T3 TSH is slightly lower than before but her free T4 and T3 are quite normal  She has not had a nuclear thyroid scan   Usually has no symptoms related to thyroid with no shakiness or palpitations   Lab Results  Component Value Date   TSH 0.16 (L) 06/12/2019   TSH 0.25 (L) 12/12/2018   TSH 0.26 (L) 06/05/2018   FREET4 0.95 06/12/2019   FREET4 0.86 12/12/2018   FREET4 0.74 06/05/2018      Allergies as of 06/17/2019      Reactions   Latex Swelling      Medication List       Accurate as of June 17, 2019  3:46 PM. If you have any questions, ask your nurse or doctor.        STOP taking these medications   multivitamin tablet Stopped by: [name], MD     TAKE these medications   phentermine 15 MG capsule Take 1 capsule (15 mg total) by mouth daily.       Allergies:  Allergies  Allergen Reactions  . Latex Swelling    Past Medical History:  Diagnosis Date  . Anemia   . Dysmenorrhea   . Herpes   . History of kidney stones   . Thyroid nodule    bx 04-2013    Past Surgical History:  Procedure Laterality Date  . BREAST EXCISIONAL BIOPSY Left 2004  . BREAST SURGERY     biospy  . EXTRACORPOREAL SHOCK WAVE LITHOTRIPSY Left 11/03/2016   Procedure: LEFT EXTRACORPOREAL SHOCK WAVE LITHOTRIPSY (ESWL);  Surgeon: Festus Aloe, MD;  Location: [facility];  Service: Urology;  Laterality: Left;  . FOOT SURGERY Bilateral   . NOVASURE ABLATION      Family History  Problem Relation Age of Onset  .  Hypertension Mother        Jerilynn Mages- brother   . Leukemia Mother        deceased  . Diabetes Brother   . Breast cancer Maternal Aunt        aunt-cousin  . Hypothyroidism Sister        had thyroid surgery  . Breast cancer Cousin   . HIV Son   . Colon cancer Neg Hx   . CAD Neg Hx     Social History:  reports that she has never smoked. She has never used smokeless tobacco. She reports that she does not drink alcohol or use drugs.  Review of Systems   No hyperlipidemia  Lab Results  Component Value Date   CHOL 180 06/12/2019   HDL 78.10 06/12/2019   LDLCALC 90 06/12/2019   TRIG 56.0 06/12/2019   CHOLHDL 2 06/12/2019     LABS:  Lab on 06/12/2019  Component Date Value Ref Range Status  . T3, Free 06/12/2019 2.9  2.3 - 4.2 pg/mL Final  . Free T4 06/12/2019 0.95  0.60 - 1.60 ng/dL Final   Comment: Specimens from patients who are undergoing biotin therapy  and /or ingesting biotin supplements may contain high levels of biotin.  The higher biotin concentration in these specimens interferes with this Free T4 assay.  Specimens that contain high levels  of biotin may cause false high results for this Free T4 assay.  Please interpret results in light of the total clinical presentation of the patient.    Marland Kitchen TSH 06/12/2019 0.16* 0.35 - 4.50 uIU/mL Final  . Cholesterol 06/12/2019 180  0 - 200 mg/dL Final   ATP III Classification       Desirable:  < 200 mg/dL               Borderline High:  200 - 239 mg/dL          High:  > = 240 mg/dL  . Triglycerides 06/12/2019 56.0  0.0 - 149.0 mg/dL Final   Normal:  <150 mg/dLBorderline High:  150 - 199 mg/dL  . HDL 06/12/2019 78.10  >39.00 mg/dL Final  . VLDL 06/12/2019 11.2  0.0 - 40.0 mg/dL Final  . LDL Cholesterol 06/12/2019 90  0 - 99 mg/dL Final  . Total CHOL/HDL Ratio 06/12/2019 2   Final                  Men          Women1/2 Average Risk     3.4          3.3Average Risk          5.0          4.42X Average Risk          9.6          7.13X Average Risk          15.0          11.0                      . NonHDL 06/12/2019 101.57   Final   NOTE:  Non-HDL goal should be 30 mg/dL higher than patient's LDL goal (i.e. LDL goal of < 70 mg/dL, would have non-HDL goal of < 100 mg/dL)    EXAM:  BP (!) 142/70 (BP Location: Left Arm, Patient Position: Sitting, Cuff Size: Normal)   Pulse 68   Ht 5\' 6"  (1.676 m)   Wt  161 lb 6.4 oz (73.2 kg)   SpO2 97%   BMI 26.05 kg/m    Thyroid nodule is palpable mostly in the midline Nodule measures about 2.5-3 cm, better felt on swallowing  Lateral part of the thyroid not palpable  No neck lymphadenopathy  Assessment/Plan:   Obesity with baseline BMI 33:  She is on combination of low-dose phentermine  15 mg and twice daily Topamax    Her weight has leveled off recently This is despite her not watching her diet and eating more carbohydrates or sweets to relieve stress  Does not have a exercise routine also  She says that she is trying to use higher doses of Topamax that initially prescribed to help her with the stress eating She has had no side effects taking 150 mg total per day Discussed however that for long-term use 15 mg of phentermine would be the only safe dose  THYROID nodule with persistently slightly low TSH: Still has about a 2.5-3 cm midline nodule which is smooth and not excessively firm On exam the nodule is about the same or slightly smaller  As before her TSH is slightly low indicating that she may have been autonomous functioning nodule No increase in free T4 T3 despite TSH being slightly lower than before  She will come back in 6 months [name] 06/17/2019, 3:46 PM

## 2019-12-16 ENCOUNTER — Other Ambulatory Visit (INDEPENDENT_AMBULATORY_CARE_PROVIDER_SITE_OTHER): Payer: 59

## 2019-12-16 ENCOUNTER — Other Ambulatory Visit: Payer: Self-pay

## 2019-12-16 DIAGNOSIS — Z8639 Personal history of other endocrine, nutritional and metabolic disease: Secondary | ICD-10-CM

## 2019-12-16 DIAGNOSIS — E041 Nontoxic single thyroid nodule: Secondary | ICD-10-CM | POA: Diagnosis not present

## 2019-12-16 LAB — BASIC METABOLIC PANEL
BUN: 7 mg/dL (ref 6–23)
CO2: 21 mEq/L (ref 19–32)
Calcium: 10 mg/dL (ref 8.4–10.5)
Chloride: 112 mEq/L (ref 96–112)
Creatinine, Ser: 0.67 mg/dL (ref 0.40–1.20)
GFR: 109.36 mL/min (ref >60.00)
Glucose, Bld: 78 mg/dL (ref 70–99)
Potassium: 3.7 mEq/L (ref 3.5–5.1)
Sodium: 140 mEq/L (ref 135–145)

## 2019-12-16 LAB — T3, FREE: T3, Free: 2.8 pg/mL (ref 2.3–4.2)

## 2019-12-16 LAB — TSH: TSH: 0.35 u[IU]/mL (ref 0.35–4.50)

## 2019-12-16 LAB — T4, FREE: Free T4: 0.84 ng/dL (ref 0.60–1.60)

## 2019-12-20 ENCOUNTER — Encounter: Payer: Self-pay | Admitting: Endocrinology

## 2019-12-20 ENCOUNTER — Ambulatory Visit: Payer: 59 | Admitting: Endocrinology

## 2019-12-20 ENCOUNTER — Other Ambulatory Visit: Payer: Self-pay

## 2019-12-20 VITALS — BP 124/80 | HR 64 | Ht 66.0 in | Wt 158.0 lb

## 2019-12-20 DIAGNOSIS — E041 Nontoxic single thyroid nodule: Secondary | ICD-10-CM

## 2019-12-20 DIAGNOSIS — R7989 Other specified abnormal findings of blood chemistry: Secondary | ICD-10-CM | POA: Diagnosis not present

## 2019-12-20 DIAGNOSIS — Z8639 Personal history of other endocrine, nutritional and metabolic disease: Secondary | ICD-10-CM

## 2019-12-20 NOTE — Progress Notes (Signed)
Kathleen Burton 59 y.o.   Chief complaint: Followup of thyroid and weight management    History of Present Illness:   Problem 1: Weight management  Because of a weight gain of about 15 pounds and difficulty with losing weight with diet and exercise regimen she wanted weight loss medications.  She was asked to try Qsymia but because of lack of coverage she was given the equivalent doses in generic form She continues on phentermine  15 mg daily and Topamax  50 mg daily with which she had improved satiety and had significant weight loss She has not had any formal dietitian consultation  Recent history: She did lose a little weight since her last visit She had started an exercise program with the group but not for some time now Also overall has been more motivated However over the last few weeks she has had more stress related to her work situation and exposure to colleagues with Covid infections  She likes to eat some sweets to relieve her stress  She still feels like she is benefiting from a total of 3 tablets of Topamax daily She takes phentermine 15 mg once daily No side effects from this and her pulse is better today   Wt Readings from Last 3 Encounters:  12/20/19 158 lb (71.7 kg)  06/17/19 161 lb 6.4 oz (73.2 kg)  12/14/18 160 lb (72.6 kg)    Problem 2:  Thyroid nodule on the left side which was biopsied in 04/2013 and was found to be benign.  This has measured about 3 cm consistently on previous exams and is mostly in the isthmus area  She has had a persistently low TSH without high free T4 or free T3 TSH is now in the normal range compared to previous levels As before free T4 is normal as also free T3   She has not had a nuclear thyroid scan  No local pressure sensation   Lab Results  Component Value Date   TSH 0.35 12/16/2019   TSH 0.16 (L) 06/12/2019   TSH 0.25 (L) 12/12/2018   FREET4 0.84 12/16/2019   FREET4 0.95 06/12/2019   FREET4 0.86  12/12/2018     Allergies as of 12/20/2019      Reactions   Latex Swelling      Medication List       Accurate as of December 20, 2019  9:12 AM. If you have any questions, ask your nurse or doctor.        phentermine 15 MG capsule Take 1 capsule (15 mg total) by mouth daily.   topiramate 50 MG tablet Commonly known as: TOPAMAX 2 tablets in the morning and 1 in the evening       Allergies:  Allergies  Allergen Reactions  . Latex Swelling    Past Medical History:  Diagnosis Date  . Anemia   . Dysmenorrhea   . Herpes   . History of kidney stones   . Thyroid nodule    bx 04-2013    Past Surgical History:  Procedure Laterality Date  . BREAST EXCISIONAL BIOPSY Left 2004  . BREAST SURGERY     biospy  . EXTRACORPOREAL SHOCK WAVE LITHOTRIPSY Left 11/03/2016   Procedure: LEFT EXTRACORPOREAL SHOCK WAVE LITHOTRIPSY (ESWL);  Surgeon: Festus Aloe, MD;  Location: [facility];  Service: Urology;  Laterality: Left;  . FOOT SURGERY Bilateral   . NOVASURE ABLATION      Family History  Problem Relation Age of Onset  .  Hypertension Mother        Jerilynn Mages- brother   . Leukemia Mother        deceased  . Diabetes Brother   . Breast cancer Maternal Aunt        aunt-cousin  . Hypothyroidism Sister        had thyroid surgery  . Breast cancer Cousin   . HIV Son   . Colon cancer Neg Hx   . CAD Neg Hx     Social History:  reports that she has never smoked. She has never used smokeless tobacco. She reports that she does not drink alcohol or use drugs.  Review of Systems   No hyperlipidemia  Lab Results  Component Value Date   CHOL 180 06/12/2019   HDL 78.10 06/12/2019   LDLCALC 90 06/12/2019   TRIG 56.0 06/12/2019   CHOLHDL 2 06/12/2019   She previously had low potassium levels of unclear etiology and these are now consistently normal now  LABS:  Lab on 12/16/2019  Component Date Value Ref Range Status  . Sodium 12/16/2019 140  135 - 145 mEq/L Final  . Potassium 12/16/2019  3.7  3.5 - 5.1 mEq/L Final  . Chloride 12/16/2019 112  96 - 112 mEq/L Final  . CO2 12/16/2019 21  19 - 32 mEq/L Final  . Glucose, Bld 12/16/2019 78  70 - 99 mg/dL Final  . BUN 12/16/2019 7  6 - 23 mg/dL Final  . Creatinine, Ser 12/16/2019 0.67  0.40 - 1.20 mg/dL Final  . GFR 12/16/2019 109.36  >60.00 mL/min Final  . Calcium 12/16/2019 10.0  8.4 - 10.5 mg/dL Final  . T3, Free 12/16/2019 2.8  2.3 - 4.2 pg/mL Final  . Free T4 12/16/2019 0.84  0.60 - 1.60 ng/dL Final   Comment: Specimens from patients who are undergoing biotin therapy and /or ingesting biotin supplements may contain high levels of biotin.  The higher biotin concentration in these specimens interferes with this Free T4 assay.  Specimens that contain high levels  of biotin may cause false high results for this Free T4 assay.  Please interpret results in light of the total clinical presentation of the patient.    Marland Kitchen TSH 12/16/2019 0.35  0.35 - 4.50 uIU/mL Final    EXAM:  BP 124/80 (BP Location: Left Arm, Patient Position: Sitting, Cuff Size: Normal)   Pulse 64   Ht 5\' 6"  (1.676 m)   Wt 158 lb (71.7 kg)   SpO2 98%   BMI 25.50 kg/m    She has a thyroid nodule in the isthmus and slightly to the left Nodule measures about 3 cm, smooth and relatively firm  Right and left lobes of the thyroid are not enlarged  No neck lymphadenopathy  Assessment/Plan:   Obesity with baseline BMI 33:  She is on combination of low-dose phentermine  15 mg and twice daily Topamax Has taken this long-term without any side effects Has been able to maintain her weight loss with some fluctuation  She has lost 3 pounds, in the last few months has done a little better with diet and exercise although inconsistent  She will stay on the same regimen along with a total of 150 mg of Topamax a day  THYROID nodule with previously slightly low TSH: She has a stable 3 cm midline nodule which is smooth texture is the same as before  Now TSH is low  normal and not suppressed as before She likely has an autonomous area in her  thyroid, likely in the nodule No further evaluation needed  History of hypokalemia: Potassium is now consistently normal  She will come back in 6 months  Mandie Crabbe 12/20/2019, 9:12 AM

## 2019-12-23 ENCOUNTER — Other Ambulatory Visit: Payer: Self-pay | Admitting: Endocrinology

## 2019-12-23 ENCOUNTER — Telehealth: Payer: Self-pay

## 2019-12-23 NOTE — Telephone Encounter (Signed)
Prescription has been sent, did not have my finger print signature pad  on the weekend.

## 2019-12-23 NOTE — Telephone Encounter (Signed)
Pt states that she did not receive her refill of phentermine that was supposed to go to [facility] in [place].

## 2019-12-24 ENCOUNTER — Other Ambulatory Visit: Payer: Self-pay

## 2019-12-24 ENCOUNTER — Telehealth: Payer: Self-pay | Admitting: Endocrinology

## 2019-12-24 MED ORDER — TOPIRAMATE 50 MG PO TABS: ORAL_TABLET | ORAL | 1 refills | Status: DC

## 2019-12-24 NOTE — Telephone Encounter (Signed)
MEDICATION: topamax  PHARMACY:   [facility], [place] [employer] Phone:  [phone]  Fax:  [phone]      IS THIS A 90 DAY SUPPLY : yes  IS PATIENT OUT OF MEDICATION: no  IF NOT; HOW MUCH IS LEFT: "I have enough to last until I see him next"  LAST APPOINTMENT DATE: 12/20/2019  NEXT APPOINTMENT DATE: 06/26/20  DO WE HAVE YOUR PERMISSION TO LEAVE A DETAILED MESSAGE: yes  OTHER COMMENTS:    **Let patient know to contact pharmacy at the end of the day to make sure medication is ready. **  ** Please notify patient to allow 48-72 hours to process**  **Encourage patient to contact the pharmacy for refills or they can request refills through [facility]**

## 2019-12-24 NOTE — Telephone Encounter (Signed)
Noted  

## 2019-12-24 NOTE — Telephone Encounter (Signed)
Rx sent 

## 2020-03-10 ENCOUNTER — Other Ambulatory Visit: Payer: Self-pay | Admitting: Obstetrics and Gynecology

## 2020-03-10 DIAGNOSIS — Z1231 Encounter for screening mammogram for malignant neoplasm of breast: Secondary | ICD-10-CM

## 2020-04-15 ENCOUNTER — Ambulatory Visit: Payer: 59

## 2020-04-21 ENCOUNTER — Other Ambulatory Visit: Payer: Self-pay

## 2020-04-21 ENCOUNTER — Ambulatory Visit
Admission: RE | Admit: 2020-04-21 | Discharge: 2020-04-21 | Disposition: A | Discharge status: Home / Self Care | Payer: 59 | Source: Ambulatory Visit | Attending: Obstetrics and Gynecology | Admitting: Obstetrics and Gynecology

## 2020-04-21 DIAGNOSIS — Z1231 Encounter for screening mammogram for malignant neoplasm of breast: Secondary | ICD-10-CM

## 2020-05-19 ENCOUNTER — Other Ambulatory Visit: Payer: Self-pay | Admitting: Endocrinology

## 2020-05-28 LAB — CBC AND DIFFERENTIAL
HCT: 38 (ref 36–46)
Hemoglobin: 12.5 (ref 12.0–16.0)
Platelets: 343 (ref 150–399)
WBC: 3.7

## 2020-05-28 LAB — CBC: RBC: 4.73 (ref 3.87–5.11)

## 2020-05-28 LAB — COMPREHENSIVE METABOLIC PANEL: Albumin: 4.2 (ref 3.5–5.0)

## 2020-05-29 LAB — HEPATIC FUNCTION PANEL
ALT: 11 (ref 7–35)
AST: 10 — AB (ref 13–35)
Alkaline Phosphatase: 154 — AB (ref 25–125)
Bilirubin, Direct: 0.08 (ref 0.01–0.4)
Bilirubin, Total: 0.2

## 2020-06-23 ENCOUNTER — Other Ambulatory Visit (INDEPENDENT_AMBULATORY_CARE_PROVIDER_SITE_OTHER): Payer: 59

## 2020-06-23 ENCOUNTER — Other Ambulatory Visit: Payer: Self-pay

## 2020-06-23 DIAGNOSIS — Z8639 Personal history of other endocrine, nutritional and metabolic disease: Secondary | ICD-10-CM

## 2020-06-23 DIAGNOSIS — R7989 Other specified abnormal findings of blood chemistry: Secondary | ICD-10-CM | POA: Diagnosis not present

## 2020-06-23 LAB — BASIC METABOLIC PANEL
BUN: 12 mg/dL (ref 6–23)
CO2: 20 mEq/L (ref 19–32)
Calcium: 10.3 mg/dL (ref 8.4–10.5)
Chloride: 112 mEq/L (ref 96–112)
Creatinine, Ser: 0.73 mg/dL (ref 0.40–1.20)
GFR: 98.87 mL/min (ref >60.00)
Glucose, Bld: 66 mg/dL — ABNORMAL LOW (ref 70–99)
Potassium: 3.8 mEq/L (ref 3.5–5.1)
Sodium: 140 mEq/L (ref 135–145)

## 2020-06-24 LAB — T4, FREE: Free T4: 0.9 ng/dL (ref 0.60–1.60)

## 2020-06-24 LAB — TSH: TSH: 0.25 u[IU]/mL — ABNORMAL LOW (ref 0.35–4.50)

## 2020-06-24 LAB — T3, FREE: T3, Free: 3.1 pg/mL (ref 2.3–4.2)

## 2020-06-26 ENCOUNTER — Ambulatory Visit: Payer: 59 | Admitting: Endocrinology

## 2020-06-26 ENCOUNTER — Encounter: Payer: Self-pay | Admitting: Endocrinology

## 2020-06-26 ENCOUNTER — Other Ambulatory Visit: Payer: Self-pay

## 2020-06-26 VITALS — BP 122/76 | HR 77 | Ht 66.0 in | Wt 160.6 lb

## 2020-06-26 DIAGNOSIS — Z8639 Personal history of other endocrine, nutritional and metabolic disease: Secondary | ICD-10-CM | POA: Diagnosis not present

## 2020-06-26 DIAGNOSIS — E041 Nontoxic single thyroid nodule: Secondary | ICD-10-CM

## 2020-06-26 NOTE — Progress Notes (Signed)
Kathleen Burton 59 y.o.   Chief complaint: Followup of thyroid and weight management    History of Present Illness:   Problem 1: Weight management  Because of a weight gain of about 15 pounds and difficulty with losing weight with diet and exercise regimen she wanted weight loss medications.  She was asked to try Qsymia but because of lack of coverage she was given the equivalent doses in generic form  She has not had any formal dietitian consultation  Recent history: She continues to be taking phentermine  15 mg daily and Topamax  50 mg, 2 in the morning and 1 in the evening with which she had improved satiety and had significant weight loss  She still feels like she is benefiting from a total of 3 tablets of Topamax daily instead of the lower dose She takes phentermine 15 mg once daily without any side effects Has not had any palpitations or shakiness Although her weight has fluctuated but is about the same  Diet is generally fairly good with occasional times when she is eating more carbohydrate Has had a regular exercise regimen lately  Wt Readings from Last 3 Encounters:  06/26/20 160 lb 9.6 oz (72.8 kg)  12/20/19 158 lb (71.7 kg)  06/17/19 161 lb 6.4 oz (73.2 kg)    Problem 2:  Thyroid nodule: This was biopsied in 04/2013 and was found to be benign. Previous ultrasound had shown partially cystic nodule in the midline, measuring 2.4 cm in 2014 This has measured about 3 cm consistently on previous exams palpable in the isthmus area  She usually has a low TSH without high free T4 or free T3 TSH is now slightly below normal range compared to previous level of 0.35 As before free T4 is normal along with free T3  She has not had a nuclear thyroid scan  No local pressure sensation   Lab Results  Component Value Date   TSH 0.25 (L) 06/23/2020   TSH 0.35 12/16/2019   TSH 0.16 (L) 06/12/2019   FREET4 0.90 06/23/2020   FREET4 0.84 12/16/2019   FREET4  0.95 06/12/2019   Lab Results  Component Value Date   T3FREE 3.1 06/23/2020   T3FREE 2.8 12/16/2019   T3FREE 2.9 06/12/2019     Allergies as of 06/26/2020      Reactions   Latex Swelling      Medication List       Accurate as of June 26, 2020  9:19 AM. If you have any questions, ask your nurse or doctor.        latanoprost 0.005 % ophthalmic solution Commonly known as: XALATAN 1 drop at bedtime.   phentermine 15 MG capsule TAKE ONE CAPSULE BY MOUTH DAILY   topiramate 50 MG tablet Commonly known as: TOPAMAX 2 tablets in the morning and 1 in the evening       Allergies:  Allergies  Allergen Reactions  . Latex Swelling    Past Medical History:  Diagnosis Date  . Anemia   . Dysmenorrhea   . Herpes   . History of kidney stones   . Thyroid nodule    bx 04-2013    Past Surgical History:  Procedure Laterality Date  . BREAST EXCISIONAL BIOPSY Left 2004  . BREAST SURGERY     biospy  . EXTRACORPOREAL SHOCK WAVE LITHOTRIPSY Left 11/03/2016   Procedure: LEFT EXTRACORPOREAL SHOCK WAVE LITHOTRIPSY (ESWL);  Surgeon: Festus Aloe, MD;  Location: [facility];  Service: Urology;  Laterality: Left;  . FOOT SURGERY Bilateral   . NOVASURE ABLATION      Family History  Problem Relation Age of Onset  . Hypertension Mother        Jerilynn Mages- brother   . Leukemia Mother        deceased  . Diabetes Brother   . Breast cancer Maternal Aunt        aunt-cousin  . Hypothyroidism Sister        had thyroid surgery  . Breast cancer Cousin   . HIV Son   . Colon cancer Neg Hx   . CAD Neg Hx     Social History:  reports that she has never smoked. She has never used smokeless tobacco. She reports that she does not drink alcohol and does not use drugs.  Review of Systems   No hyperlipidemia  Lab Results  Component Value Date   CHOL 180 06/12/2019   HDL 78.10 06/12/2019   LDLCALC 90 06/12/2019   TRIG 56.0 06/12/2019   CHOLHDL 2 06/12/2019   She previously had low  potassium levels of unclear etiology and these are now consistently normal  And She is complaining about right leg pain with difficulty in movement and walking.  She thought she had this associated with low potassium but potassium has been more consistently normal  LABS:  Lab on 06/23/2020  Component Date Value Ref Range Status  . Sodium 06/23/2020 140  135 - 145 mEq/L Final  . Potassium 06/23/2020 3.8  3.5 - 5.1 mEq/L Final  . Chloride 06/23/2020 112  96 - 112 mEq/L Final  . CO2 06/23/2020 20  19 - 32 mEq/L Final  . Glucose, Bld 06/23/2020 66* 70 - 99 mg/dL Final  . BUN 06/23/2020 12  6 - 23 mg/dL Final  . Creatinine, Ser 06/23/2020 0.73  0.40 - 1.20 mg/dL Final  . GFR 06/23/2020 98.87  >60.00 mL/min Final  . Calcium 06/23/2020 10.3  8.4 - 10.5 mg/dL Final  . T3, Free 06/23/2020 3.1  2.3 - 4.2 pg/mL Final  . Free T4 06/23/2020 0.90  0.60 - 1.60 ng/dL Final   Comment: Specimens from patients who are undergoing biotin therapy and /or ingesting biotin supplements may contain high levels of biotin.  The higher biotin concentration in these specimens interferes with this Free T4 assay.  Specimens that contain high levels  of biotin may cause false high results for this Free T4 assay.  Please interpret results in light of the total clinical presentation of the patient.    Marland Kitchen TSH 06/23/2020 0.25* 0.35 - 4.50 uIU/mL Final    EXAM:  BP 122/76 (BP Location: Left Arm, Patient Position: Sitting, Cuff Size: Large)   Pulse 77   Ht 5\' 6"  (1.676 m)   Wt 160 lb 9.6 oz (72.8 kg)   SpO2 95%   BMI 25.92 kg/m    Thyroid nodule is palpable in the isthmus  Nodule measures about 3 cm, smooth and relatively firm Thyroid enlargement is felt extending towards the right lobe also  Lateral right and left lobes of the thyroid are not enlarged  No neck lymphadenopathy No tremor  Assessment/Plan:   Obesity with baseline BMI 33:   She is on combination of low-dose phentermine  15 mg and twice daily  Topamax Has not had any side effects taking these long-term and prefers relatively higher dose of Topamax Has been able to maintain her weight loss with minimal change lately As before she is sometimes not able  to control her carbohydrate intake but usually eating healthy meals Is trying to be active when she can but not in the last month with the leg pain as above  She will stay on the same regimen including a total of 150 mg of Topamax a day  THYROID nodule with slightly low TSH: She has a stable 3 cm midline nodule with smooth texture, about the same as before Has minimal enlargement of the right medial lobe also without distinct nodule felt  She likely has an autonomous area in her thyroid, likely in the area of enlargement No further evaluation needed at this time as clinically her thyroid swelling is stable Also she has no significant change in thyroid levels  History of hypokalemia: Potassium is consistently normal  She will follow up with her PCP regarding right leg pain, may be femoral bursitis  She will come back in 6 months again for follow-up  [name] 06/26/2020, 9:19 AM

## 2020-07-01 ENCOUNTER — Other Ambulatory Visit: Payer: Self-pay | Admitting: Endocrinology

## 2020-07-01 NOTE — Telephone Encounter (Signed)
Topamax refill request.  Last seen 06/26/2020, last filled 12/24/2019.  Please advise.

## 2020-07-03 LAB — HM COLONOSCOPY

## 2020-07-06 ENCOUNTER — Encounter: Payer: Self-pay | Admitting: General Practice

## 2020-08-21 ENCOUNTER — Other Ambulatory Visit: Payer: Self-pay | Admitting: Endocrinology

## 2020-09-28 ENCOUNTER — Telehealth: Payer: Self-pay | Admitting: General Practice

## 2020-09-28 NOTE — Telephone Encounter (Signed)
That is okay, thank you

## 2020-09-28 NOTE — Telephone Encounter (Signed)
Okay to schedule NP appt.  

## 2020-09-28 NOTE — Telephone Encounter (Signed)
Patient would like to re-establish care with you

## 2020-09-28 NOTE — Telephone Encounter (Signed)
Please advise 

## 2020-09-29 NOTE — Telephone Encounter (Signed)
Spoke to pt. appt scheduled

## 2020-10-20 ENCOUNTER — Other Ambulatory Visit: Payer: Self-pay

## 2020-10-20 ENCOUNTER — Encounter: Payer: Self-pay | Admitting: Internal Medicine

## 2020-10-20 ENCOUNTER — Ambulatory Visit (HOSPITAL_BASED_OUTPATIENT_CLINIC_OR_DEPARTMENT_OTHER)
Admission: RE | Admit: 2020-10-20 | Discharge: 2020-10-20 | Disposition: A | Discharge status: Home / Self Care | Payer: 59 | Source: Ambulatory Visit | Attending: Internal Medicine | Admitting: Internal Medicine

## 2020-10-20 ENCOUNTER — Ambulatory Visit: Payer: 59 | Admitting: Internal Medicine

## 2020-10-20 VITALS — BP 112/75 | HR 82 | Temp 98.0°F | Resp 16 | Ht 66.0 in | Wt 155.1 lb

## 2020-10-20 DIAGNOSIS — M25551 Pain in right hip: Secondary | ICD-10-CM

## 2020-10-20 DIAGNOSIS — N390 Urinary tract infection, site not specified: Secondary | ICD-10-CM | POA: Diagnosis not present

## 2020-10-20 DIAGNOSIS — G8929 Other chronic pain: Secondary | ICD-10-CM | POA: Diagnosis not present

## 2020-10-20 DIAGNOSIS — R319 Hematuria, unspecified: Secondary | ICD-10-CM

## 2020-10-20 LAB — POC URINALSYSI DIPSTICK (AUTOMATED)
Bilirubin, UA: NEGATIVE
Glucose, UA: NEGATIVE
Ketones, UA: NEGATIVE
Nitrite, UA: POSITIVE
Protein, UA: POSITIVE — AB
Spec Grav, UA: 1.02 (ref 1.010–1.025)
Urobilinogen, UA: 0.2 E.U./dL
pH, UA: 6 (ref 5.0–8.0)

## 2020-10-20 LAB — URINALYSIS, ROUTINE W REFLEX MICROSCOPIC
Bilirubin Urine: NEGATIVE
Ketones, ur: NEGATIVE
Nitrite: POSITIVE — AB
Specific Gravity, Urine: 1.02 (ref 1.000–1.030)
Total Protein, Urine: NEGATIVE
Urine Glucose: NEGATIVE
Urobilinogen, UA: 0.2 (ref 0.0–1.0)
pH: 6 (ref 5.0–8.0)

## 2020-10-20 NOTE — Assessment & Plan Note (Signed)
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  We will get an x-ray and refer to Ortho Indentation, right tight: pt quite concerned about it, no evidence of abscess or cellulitis.  Observation for now.   Pain control: States Aleve helps, recommend GI precautions.  Also alternate with Tylenol. UTI?  Patient requested a UA, initial possibly an infection, she has no symptoms, will get a UA urine culture. RTC for CPX at her convenience

## 2020-10-20 NOTE — Patient Instructions (Signed)
Tylenol  500 mg OTC 2 tabs a day every 8 hours as needed for pain  IBUPROFEN (Advil or Motrin) 200 mg 2 tablets BID as needed for pain.  Always take it with food because may cause gastritis and ulcers.  If you notice nausea, stomach pain, change in the color of stools --->  Stop the medicine and let us know   We are referring you to orthopedic doctor  GO TO THE FRONT DESK, PLEASE SCHEDULE YOUR APPOINTMENTS Come back for physical exam at your convenience  STOP BY THE FIRST FLOOR:  get the XR

## 2020-10-20 NOTE — Progress Notes (Signed)
Pre visit review using our clinic review tool, if applicable. No additional management support is needed unless otherwise documented below in the visit note. 

## 2020-10-20 NOTE — Progress Notes (Signed)
Subjective:    Patient ID: Kathleen Burton, female    DOB: Jul 18, 1961, 60 y.o.   MRN: [id number]  DOS:  10/20/2020 Type of visit - description: To reestablish/acute  Several months history of right leg pain. The pain is located at the anterior aspect of the right hip and proximal right thigh. Pain is steady, worse when she gets up or down from a chair. At night, is worse if she lays on the right side. Walk increases the pain mildly. No major problems with back ache.  She has noted an indentation at the right thigh, she thinks is related to the use of a hose. The area has not been red, hot.  No rash.  She thought that above issues could be from a UTI and requested a UA, denies dysuria, gross hematuria, difficulty urinating.  Review of Systems No fever chills No nausea vomiting No diarrhea or blood in the stools  Past Medical History:  Diagnosis Date  . Anemia   . Dysmenorrhea   . Glaucoma   . Herpes   . History of kidney stones   . Thyroid nodule    bx 04-2013    Past Surgical History:  Procedure Laterality Date  . BREAST EXCISIONAL BIOPSY Left 2004  . BREAST SURGERY     biospy  . EXTRACORPOREAL SHOCK WAVE LITHOTRIPSY Left 11/03/2016   Procedure: LEFT EXTRACORPOREAL SHOCK WAVE LITHOTRIPSY (ESWL);  Surgeon: Jerilee Field, MD;  Location: [facility];  Service: Urology;  Laterality: Left;  . FOOT SURGERY Bilateral   . NOVASURE ABLATION      Allergies as of 10/20/2020      Reactions   Latex Swelling      Medication List       Accurate as of October 20, 2020  9:57 PM. If you have any questions, ask your nurse or doctor.        latanoprost 0.005 % ophthalmic solution Commonly known as: XALATAN 1 drop at bedtime.   phentermine 15 MG capsule TAKE ONE CAPSULE BY MOUTH DAILY   topiramate 50 MG tablet Commonly known as: TOPAMAX TAKE 2 TABLETS BY MOUTH IN  THE MORNING AND 1 TABLET BY MOUTH IN THE EVENING          Objective:   Physical Exam Musculoskeletal:        Legs:    BP 112/75 (BP Location: Left Arm, Patient Position: Sitting, Cuff Size: Small)   Pulse 82   Temp 98 F (36.7 C) (Oral)   Resp 16   Ht 5\' 6"  (1.676 m)   Wt 155 lb 2 oz (70.4 kg)   SpO2 99%   BMI 25.04 kg/m  General:   Well developed, NAD, BMI noted.  HEENT:  Normocephalic . Face symmetric, atraumatic Lungs:  CTA B Normal respiratory effort, no intercostal retractions, no accessory muscle use. Heart: RRR,  no murmur.  Abdomen:  Not distended, soft, non-tender. No rebound or rigidity. Groins: Good femoral pulses bilaterally, no LEDs. MSK: Trochanteric bursas no TTP on either side Left hip: Rotation normal and no pain Right hip: Passive ROM limited by pain Skin: Not pale. Not jaundice Lower extremities: no pretibial edema bilaterally  Neurologic:  alert & oriented X3.  Speech normal, gait somewhat limited by pain. Psych--  Cognition and judgment appear intact.  Cooperative with normal attention span and concentration.  Behavior appropriate. No anxious or depressed appearing.     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  We will get an x-ray and refer to Ortho Indentation, right tight: pt quite concerned about it, no evidence of abscess or cellulitis.  Observation for now.   Pain control: States Aleve helps, recommend GI precautions.  Also alternate with Tylenol. UTI?  Patient requested a UA, initial possibly an infection, she has no symptoms, will get a UA urine culture. RTC for CPX at her convenience   This visit occurred during the SARS-CoV-2 public health emergency.  Safety protocols were in place, including screening questions prior to the visit, additional usage of staff PPE, and extensive cleaning of exam room while observing appropriate contact time as indicated for disinfecting  solutions.

## 2020-10-22 LAB — URINE CULTURE
MICRO NUMBER:: 11379897
SPECIMEN QUALITY:: ADEQUATE

## 2020-10-22 MED ORDER — SULFAMETHOXAZOLE-TRIMETHOPRIM 800-160 MG PO TABS: 1.0000 | ORAL_TABLET | Freq: Two times a day (BID) | ORAL | 0 refills | Status: DC

## 2020-10-22 NOTE — Addendum Note (Signed)
Addended byConrad Honor D on: 10/22/2020 03:44 PM   Modules accepted: Orders

## 2020-10-27 ENCOUNTER — Other Ambulatory Visit: Payer: Self-pay

## 2020-10-27 DIAGNOSIS — Z20822 Contact with and (suspected) exposure to covid-19: Secondary | ICD-10-CM

## 2020-10-31 LAB — NOVEL CORONAVIRUS, NAA: SARS-CoV-2, NAA: DETECTED — AB

## 2020-12-08 ENCOUNTER — Encounter: Payer: Self-pay | Admitting: Internal Medicine

## 2020-12-22 ENCOUNTER — Other Ambulatory Visit (INDEPENDENT_AMBULATORY_CARE_PROVIDER_SITE_OTHER): Payer: 59

## 2020-12-22 ENCOUNTER — Other Ambulatory Visit: Payer: Self-pay

## 2020-12-22 DIAGNOSIS — Z8639 Personal history of other endocrine, nutritional and metabolic disease: Secondary | ICD-10-CM | POA: Diagnosis not present

## 2020-12-22 DIAGNOSIS — E041 Nontoxic single thyroid nodule: Secondary | ICD-10-CM

## 2020-12-22 LAB — TSH: TSH: 0.22 u[IU]/mL — ABNORMAL LOW (ref 0.35–4.50)

## 2020-12-22 LAB — T3, FREE: T3, Free: 3.2 pg/mL (ref 2.3–4.2)

## 2020-12-22 LAB — BASIC METABOLIC PANEL
BUN: 14 mg/dL (ref 6–23)
CO2: 24 mEq/L (ref 19–32)
Calcium: 9.9 mg/dL (ref 8.4–10.5)
Chloride: 109 mEq/L (ref 96–112)
Creatinine, Ser: 0.74 mg/dL (ref 0.40–1.20)
GFR: 88.76 mL/min (ref >60.00)
Glucose, Bld: 73 mg/dL (ref 70–99)
Potassium: 3.7 mEq/L (ref 3.5–5.1)
Sodium: 137 mEq/L (ref 135–145)

## 2020-12-22 LAB — T4, FREE: Free T4: 0.95 ng/dL (ref 0.60–1.60)

## 2020-12-25 ENCOUNTER — Ambulatory Visit: Payer: 59 | Admitting: Endocrinology

## 2020-12-25 ENCOUNTER — Other Ambulatory Visit: Payer: Self-pay

## 2020-12-25 ENCOUNTER — Encounter: Payer: Self-pay | Admitting: Endocrinology

## 2020-12-25 VITALS — BP 120/82 | HR 72 | Resp 18 | Ht 66.0 in | Wt 158.0 lb

## 2020-12-25 DIAGNOSIS — Z8639 Personal history of other endocrine, nutritional and metabolic disease: Secondary | ICD-10-CM

## 2020-12-25 DIAGNOSIS — E041 Nontoxic single thyroid nodule: Secondary | ICD-10-CM

## 2020-12-25 DIAGNOSIS — R7989 Other specified abnormal findings of blood chemistry: Secondary | ICD-10-CM

## 2020-12-25 NOTE — Progress Notes (Signed)
Kathleen Burton 60 y.o.   Chief complaint: Followup of thyroid and weight management    History of Present Illness:   Problem 1: Weight management  Because of a weight gain of about 15 pounds and difficulty with losing weight with diet and exercise regimen she wanted weight loss medications.  She was asked to try Qsymia but because of lack of coverage she was given the equivalent doses in generic form  She has not had any formal dietitian consultation  Recent history: She continues to be taking phentermine  15 mg daily and Topamax  50 mg, 2 in the morning and 1 in the evening This resulted in improved satiety and had significant weight loss when the regimen was started  She prefers a total of 3 tablets of Topamax 50 mg daily instead 2 tablets daily She takes phentermine 15 mg in the morning once daily Has not had any palpitations or shakiness  More recently has tried to do a little exercise but limited by hip pain  Occasionally going off diet when she is eating more carbohydrate Overall weight is slightly better than on her last visit 6 months ago  Wt Readings from Last 3 Encounters:  12/25/20 158 lb (71.7 kg)  10/20/20 155 lb 2 oz (70.4 kg)  06/26/20 160 lb 9.6 oz (72.8 kg)    Problem 2:  Thyroid nodule: This was biopsied in 04/2013 and was found to be benign. Previous ultrasound had shown partially cystic nodule in the midline, measuring 2.4 cm in 2014  This has measured about 3 cm consistently on previous exams Nodule is palpable in the isthmus area Do not feel any pressure sensation in her neck  She usually has a low TSH without high free T4 or free T3 TSH is again slightly below normal range As before free T4 is normal along with free T3  She has not had a nuclear thyroid scan     Lab Results  Component Value Date   TSH 0.22 (L) 12/22/2020   TSH 0.25 (L) 06/23/2020   TSH 0.35 12/16/2019   FREET4 0.95 12/22/2020   FREET4 0.90 06/23/2020    FREET4 0.84 12/16/2019   Lab Results  Component Value Date   T3FREE 3.2 12/22/2020   T3FREE 3.1 06/23/2020   T3FREE 2.8 12/16/2019     Allergies as of 12/25/2020      Reactions   Latex Swelling      Medication List       Accurate as of December 25, 2020 10:35 AM. If you have any questions, ask your nurse or doctor.        latanoprost 0.005 % ophthalmic solution Commonly known as: XALATAN 1 drop at bedtime.   phentermine 15 MG capsule TAKE ONE CAPSULE BY MOUTH DAILY   sulfamethoxazole-trimethoprim 800-160 MG tablet Commonly known as: BACTRIM DS Take 1 tablet by mouth 2 (two) times daily.   topiramate 50 MG tablet Commonly known as: TOPAMAX TAKE 2 TABLETS BY MOUTH IN  THE MORNING AND 1 TABLET BY MOUTH IN THE EVENING       Allergies:  Allergies  Allergen Reactions  . Latex Swelling    Past Medical History:  Diagnosis Date  . Anemia   . Dysmenorrhea   . Glaucoma   . Herpes   . History of kidney stones   . Thyroid nodule    bx 04-2013    Past Surgical History:  Procedure Laterality Date  . BREAST EXCISIONAL BIOPSY Left 2004  .  BREAST SURGERY     biospy  . EXTRACORPOREAL SHOCK WAVE LITHOTRIPSY Left 11/03/2016   Procedure: LEFT EXTRACORPOREAL SHOCK WAVE LITHOTRIPSY (ESWL);  Surgeon: Festus Aloe, MD;  Location: [facility];  Service: Urology;  Laterality: Left;  . FOOT SURGERY Bilateral   . NOVASURE ABLATION      Family History  Problem Relation Age of Onset  . Hypertension Mother        Jerilynn Mages- brother   . Leukemia Mother        deceased  . Diabetes Brother   . Breast cancer Maternal Aunt        aunt-cousin  . Hypothyroidism Sister        had thyroid surgery  . Breast cancer Cousin   . HIV Son   . Colon cancer Neg Hx   . CAD Neg Hx     Social History:  reports that she has never smoked. She has never used smokeless tobacco. She reports that she does not drink alcohol and does not use drugs.  Review of Systems   No hyperlipidemia  Lab Results   Component Value Date   CHOL 180 06/12/2019   HDL 78.10 06/12/2019   LDLCALC 90 06/12/2019   TRIG 56.0 06/12/2019   CHOLHDL 2 06/12/2019   She previously had low potassium levels of unclear etiology and these are now consistently normal     LABS:  Lab on 12/22/2020  Component Date Value Ref Range Status  . Sodium 12/22/2020 137  135 - 145 mEq/L Final  . Potassium 12/22/2020 3.7  3.5 - 5.1 mEq/L Final  . Chloride 12/22/2020 109  96 - 112 mEq/L Final  . CO2 12/22/2020 24  19 - 32 mEq/L Final  . Glucose, Bld 12/22/2020 73  70 - 99 mg/dL Final  . BUN 12/22/2020 14  6 - 23 mg/dL Final  . Creatinine, Ser 12/22/2020 0.74  0.40 - 1.20 mg/dL Final  . GFR 12/22/2020 88.76  >60.00 mL/min Final   Calculated using the CKD-EPI Creatinine Equation (2021)  . Calcium 12/22/2020 9.9  8.4 - 10.5 mg/dL Final  . T3, Free 12/22/2020 3.2  2.3 - 4.2 pg/mL Final  . Free T4 12/22/2020 0.95  0.60 - 1.60 ng/dL Final   Comment: Specimens from patients who are undergoing biotin therapy and /or ingesting biotin supplements may contain high levels of biotin.  The higher biotin concentration in these specimens interferes with this Free T4 assay.  Specimens that contain high levels  of biotin may cause false high results for this Free T4 assay.  Please interpret results in light of the total clinical presentation of the patient.    Marland Kitchen TSH 12/22/2020 0.22* 0.35 - 4.50 uIU/mL Final    EXAM:  BP 120/82 (BP Location: Left Arm, Patient Position: Sitting, Cuff Size: Normal)   Pulse 72   Resp 18   Ht 5\' 6"  (1.676 m)   Wt 158 lb (71.7 kg)   SpO2 96%   BMI 25.50 kg/m    She has a visible nodule in the isthmus  Nodule measures 2.8-3 cm, smooth and relatively firm Has a soft 1-1/2 times enlargement of the right lobe also  Left lobe not palpable  No neck lymphadenopathy No tremor  Assessment/Plan:   Obesity with baseline BMI 33:   She is on combination of low-dose phentermine  15 mg and a total of 150 mg  Topamax Has not had any side effects taking both medications long-term and prefers relatively higher dose of Topamax Has  been able to maintain her weight loss She is trying to be conscious of her diet and exercise as much as possible, limited by recent leg pain on the right  She will stay on the same regimen including a total of 150 mg of Topamax a day  THYROID nodule with slightly low TSH: She has a stable 3 cm midline nodule with smooth texture with no change for quite some time Has a mild right lobe enlargement likely simple goiter  Since she again has a low TSH without increased T4 and T3 she likely has an autonomous area in her thyroid in the area of nodule and this was discussed  No further evaluation needed again  History of hypokalemia: Potassium is consistently normal   She will come back in 6 months again for follow-up  [name] 12/25/2020, 10:35 AM

## 2020-12-28 ENCOUNTER — Encounter: Payer: Self-pay | Admitting: Internal Medicine

## 2021-01-15 ENCOUNTER — Other Ambulatory Visit: Payer: Self-pay | Admitting: Endocrinology

## 2021-01-16 ENCOUNTER — Other Ambulatory Visit: Payer: Self-pay | Admitting: Endocrinology

## 2021-01-18 NOTE — Telephone Encounter (Signed)
LOV--12/25/20. Please advise

## 2021-02-16 ENCOUNTER — Other Ambulatory Visit: Payer: Self-pay | Admitting: *Deleted

## 2021-02-16 ENCOUNTER — Other Ambulatory Visit: Payer: Self-pay | Admitting: Endocrinology

## 2021-02-16 ENCOUNTER — Telehealth: Payer: Self-pay | Admitting: Endocrinology

## 2021-02-16 MED ORDER — PHENTERMINE HCL 15 MG PO CAPS: 15.0000 mg | ORAL_CAPSULE | Freq: Every day | ORAL | 5 refills | Status: DC

## 2021-02-16 NOTE — Telephone Encounter (Signed)
Noted,  Thank you!

## 2021-02-16 NOTE — Telephone Encounter (Signed)
Has been sent with refills

## 2021-02-16 NOTE — Telephone Encounter (Signed)
Patient requesting phentermine 15 MG capsule be sent to [facility] [place].  Patient advises that her refill from on-call provider in April was only for 30 days and only has one pill available.  Pharmacy advised her to call us direct

## 2021-03-17 ENCOUNTER — Other Ambulatory Visit: Payer: Self-pay | Admitting: Obstetrics and Gynecology

## 2021-03-17 DIAGNOSIS — Z1231 Encounter for screening mammogram for malignant neoplasm of breast: Secondary | ICD-10-CM

## 2021-05-14 ENCOUNTER — Other Ambulatory Visit: Payer: Self-pay

## 2021-05-14 ENCOUNTER — Ambulatory Visit
Admission: RE | Admit: 2021-05-14 | Discharge: 2021-05-14 | Disposition: A | Discharge status: Home / Self Care | Payer: 59 | Source: Ambulatory Visit | Attending: Obstetrics and Gynecology | Admitting: Obstetrics and Gynecology

## 2021-05-14 DIAGNOSIS — Z1231 Encounter for screening mammogram for malignant neoplasm of breast: Secondary | ICD-10-CM

## 2021-05-20 ENCOUNTER — Telehealth: Payer: Self-pay

## 2021-05-20 NOTE — Telephone Encounter (Signed)
LVM for pt to contact the office to give [place] a correct DOB being that we have 2 different DOB on file.

## 2021-06-29 ENCOUNTER — Other Ambulatory Visit: Payer: Self-pay

## 2021-06-29 ENCOUNTER — Other Ambulatory Visit (INDEPENDENT_AMBULATORY_CARE_PROVIDER_SITE_OTHER): Payer: 59

## 2021-06-29 DIAGNOSIS — Z8639 Personal history of other endocrine, nutritional and metabolic disease: Secondary | ICD-10-CM | POA: Diagnosis not present

## 2021-06-29 DIAGNOSIS — R7989 Other specified abnormal findings of blood chemistry: Secondary | ICD-10-CM

## 2021-06-29 LAB — BASIC METABOLIC PANEL
BUN: 11 mg/dL (ref 6–23)
CO2: 22 mEq/L (ref 19–32)
Calcium: 10 mg/dL (ref 8.4–10.5)
Chloride: 110 mEq/L (ref 96–112)
Creatinine, Ser: 0.69 mg/dL (ref 0.40–1.20)
GFR: 94.87 mL/min (ref >60.00)
Glucose, Bld: 79 mg/dL (ref 70–99)
Potassium: 4.2 mEq/L (ref 3.5–5.1)
Sodium: 140 mEq/L (ref 135–145)

## 2021-06-29 LAB — T4, FREE: Free T4: 1.12 ng/dL (ref 0.60–1.60)

## 2021-06-29 LAB — T3, FREE: T3, Free: 2.7 pg/mL (ref 2.3–4.2)

## 2021-06-29 LAB — TSH: TSH: 0.3 u[IU]/mL — ABNORMAL LOW (ref 0.35–5.50)

## 2021-07-01 ENCOUNTER — Telehealth: Payer: Self-pay | Admitting: Endocrinology

## 2021-07-01 ENCOUNTER — Ambulatory Visit: Payer: 59 | Admitting: Endocrinology

## 2021-07-01 ENCOUNTER — Encounter: Payer: Self-pay | Admitting: Endocrinology

## 2021-07-01 ENCOUNTER — Other Ambulatory Visit: Payer: Self-pay

## 2021-07-01 VITALS — BP 130/80 | HR 84 | Ht 65.5 in | Wt 153.2 lb

## 2021-07-01 DIAGNOSIS — Z13220 Encounter for screening for lipoid disorders: Secondary | ICD-10-CM | POA: Diagnosis not present

## 2021-07-01 DIAGNOSIS — E041 Nontoxic single thyroid nodule: Secondary | ICD-10-CM | POA: Diagnosis not present

## 2021-07-01 DIAGNOSIS — R7989 Other specified abnormal findings of blood chemistry: Secondary | ICD-10-CM

## 2021-07-01 DIAGNOSIS — Z8639 Personal history of other endocrine, nutritional and metabolic disease: Secondary | ICD-10-CM

## 2021-07-01 IMAGING — DX DG HIP (WITH OR WITHOUT PELVIS) 2-3V*R*
3 series · 3 of 3 positions shown · non-contrast
Comparison: CT abdomen and pelvis from 8977

CLINICAL DATA: RIGHT hip pain for 5-6 months, anteromedial hip pain

EXAM:
DG HIP (WITH OR WITHOUT PELVIS) 2-3V RIGHT

[pelvis ap]
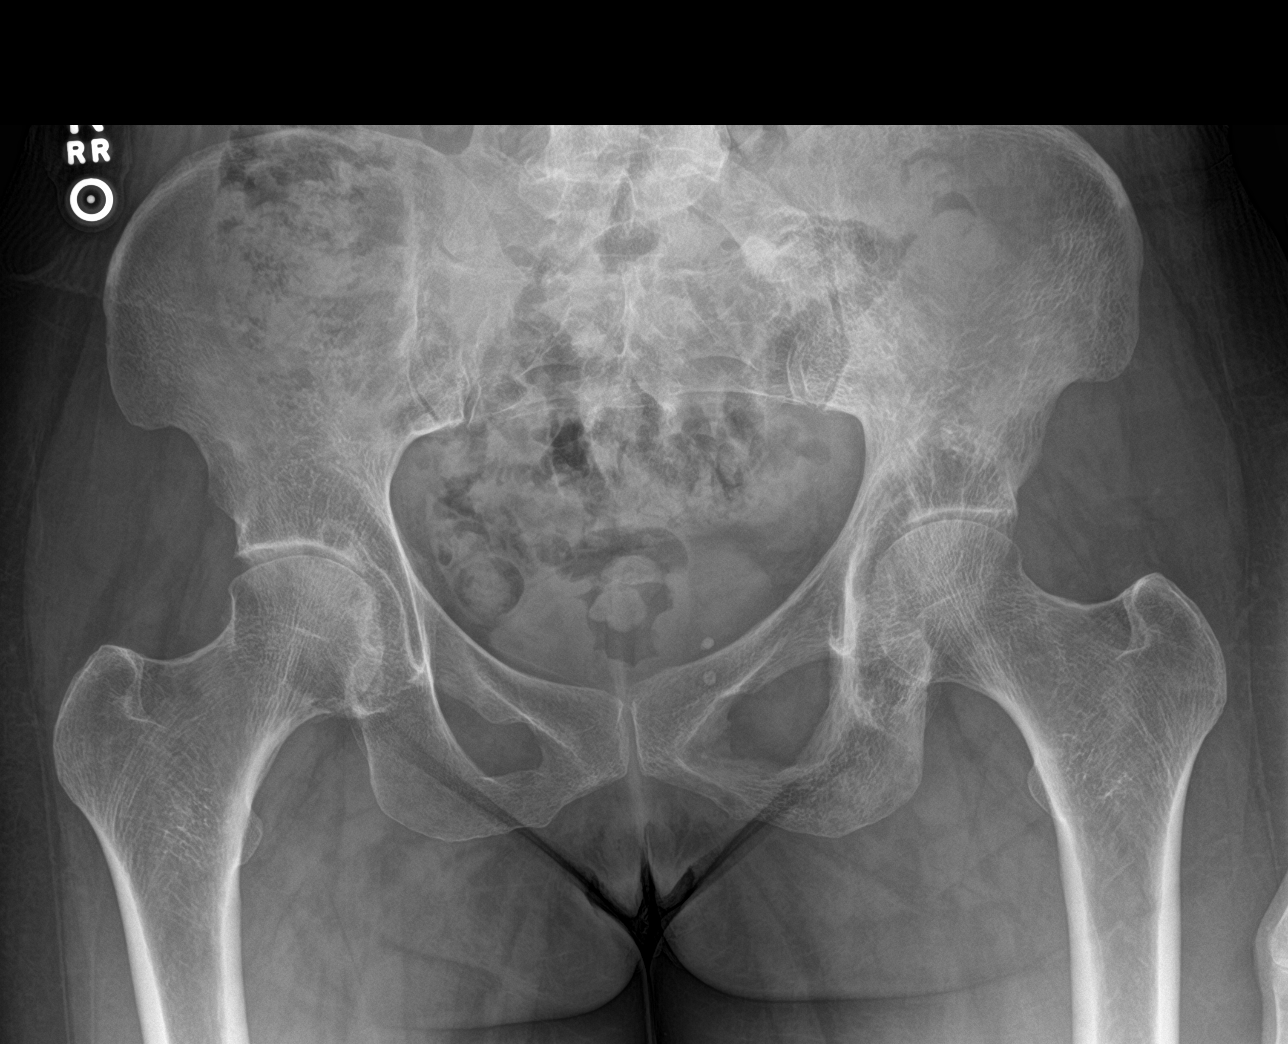

[hip ap]
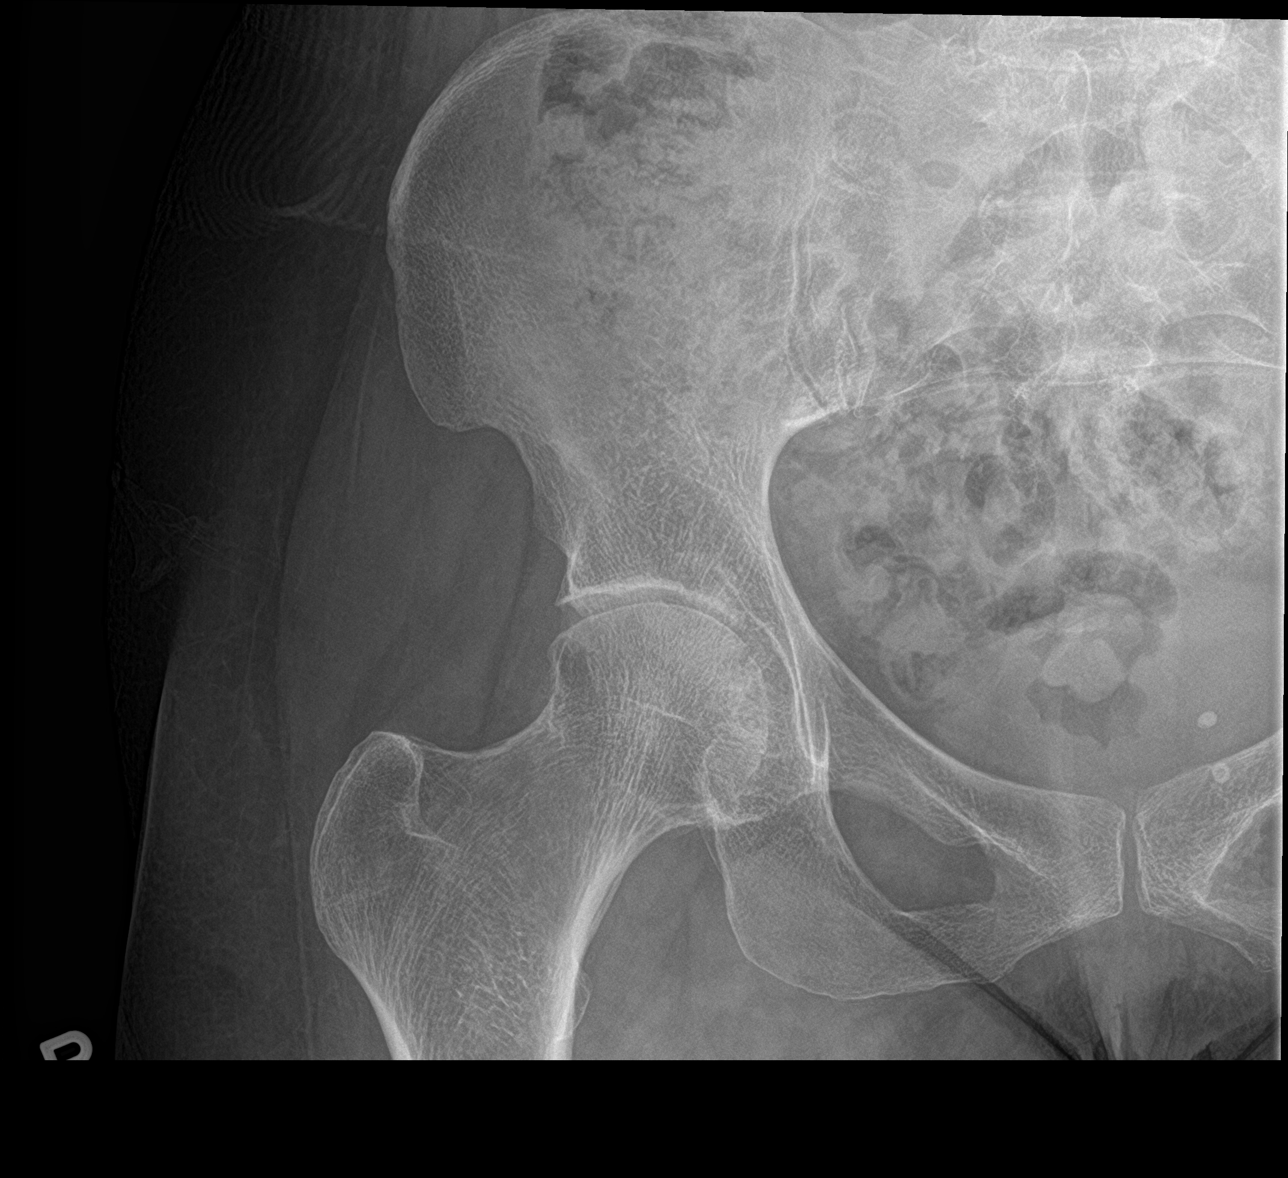

[hip lat]
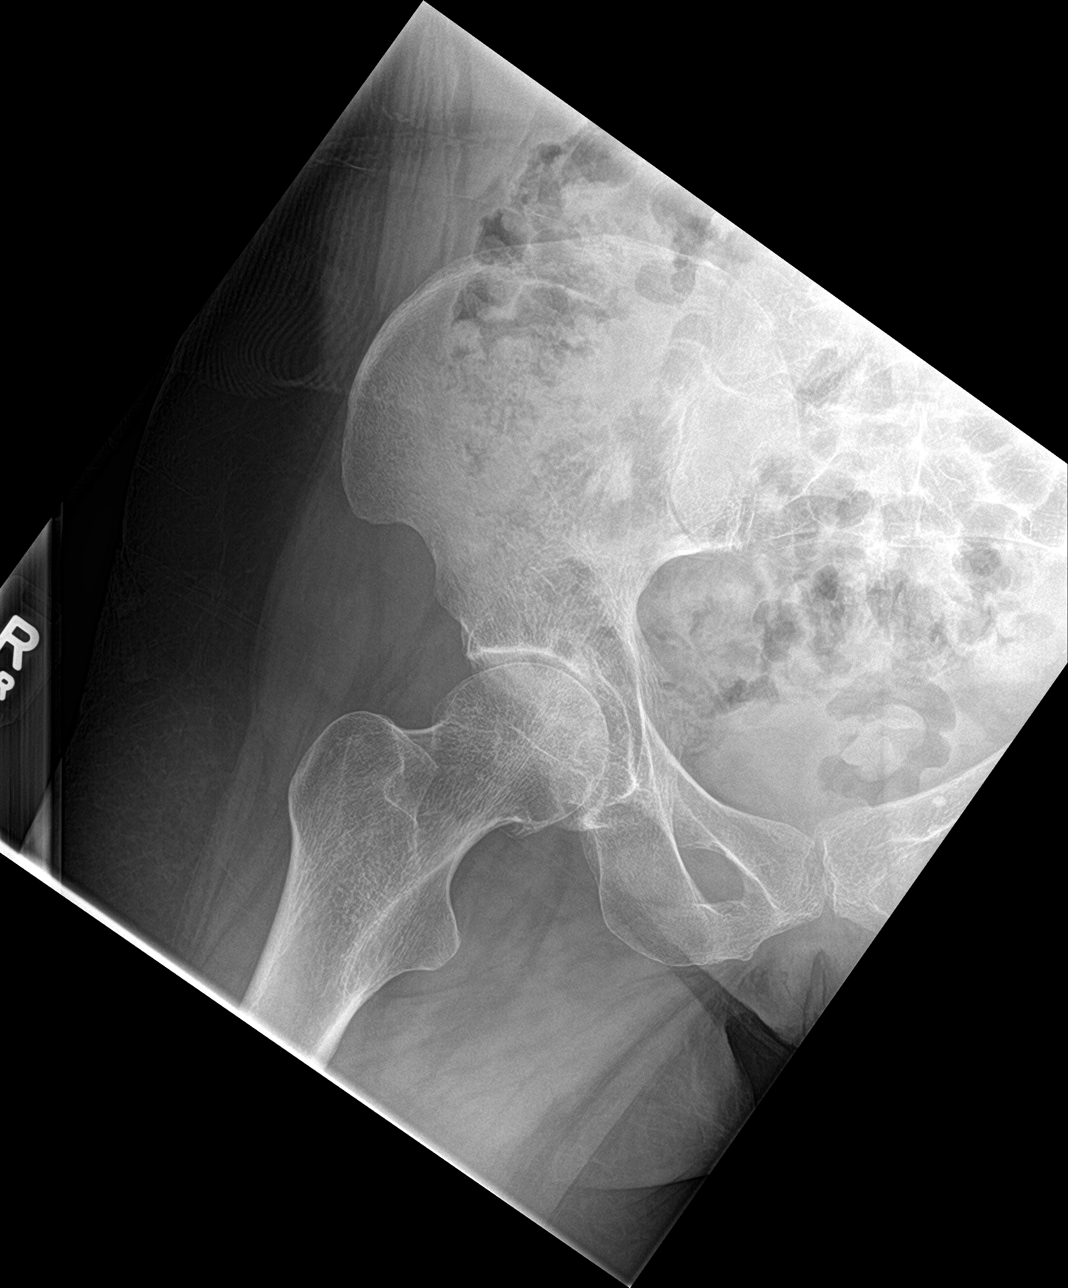

[3 of 3 positions shown; findings below may reference images not displayed]

FINDINGS: No sign of fracture or dislocation about the RIGHT hip. No fracture
of the bony pelvis.

Degenerative changes about the hips, RIGHT greater than LEFT.
Subchondral cyst formation in the femoral head and RIGHT acetabulum.

Soft tissues are unremarkable.
IMPRESSION: Degenerative changes about the hips, RIGHT greater than LEFT as
described above.

## 2021-07-01 MED ORDER — TOPIRAMATE 50 MG PO TABS: ORAL_TABLET | ORAL | 3 refills | Status: DC

## 2021-07-01 MED ORDER — PHENTERMINE HCL 15 MG PO CAPS: 15.0000 mg | ORAL_CAPSULE | Freq: Every day | ORAL | 5 refills | Status: DC

## 2021-07-01 NOTE — Telephone Encounter (Signed)
Patient called to advise that her phentermine was sent to [facility] in error - needs to go to [facility] in [facility]  Please resend [phone]

## 2021-07-01 NOTE — Progress Notes (Signed)
Kathleen Burton 60 y.o.   Chief complaint: Followup of thyroid and weight management    History of Present Illness:   Problem 1: Weight management  Because of a weight gain of about 15 pounds and difficulty with losing weight with diet and exercise regimen she wanted weight loss medications.  She was asked to try Qsymia but because of lack of coverage she was given the equivalent doses in generic form  She has not had any formal dietitian consultation  Recent history: She continues on phentermine  15 mg daily and Topamax  50 mg, 2 in the morning and 1 in the evening With this she has had improved satiety and had significant weight loss when the regimen was started  She prefers a total of 3 tablets of Topamax 50 mg daily instead 2 tablets daily Recently has run out of her medication She takes phentermine 15 mg in the morning once daily Has not had any palpitations or shakiness  Weight is down 5 pounds She is a little more active overall since she has had less hip pain Again she is thinks that stress makes her weight go down also  Wt Readings from Last 3 Encounters:  07/01/21 153 lb 3.2 oz (69.5 kg)  12/25/20 158 lb (71.7 kg)  10/20/20 155 lb 2 oz (70.4 kg)    Problem 2:  Thyroid nodule: This was biopsied in 04/2013 and was found to be benign. Previous ultrasound had shown partially cystic nodule in the midline, measuring 2.4 cm in 2014  This has measured about 3 cm consistently on previous exams Nodule is palpable in the isthmus area Do not feel any pressure sensation in her neck  She usually has a low TSH without high free T4 or free T3 TSH is again slightly below normal range but relatively better As before free T4 is normal along with free T3  She has not had a nuclear thyroid scan     Lab Results  Component Value Date   TSH 0.30 (L) 06/29/2021   TSH 0.22 (L) 12/22/2020   TSH 0.25 (L) 06/23/2020   FREET4 1.12 06/29/2021   FREET4 0.95  12/22/2020   FREET4 0.90 06/23/2020   Lab Results  Component Value Date   T3FREE 2.7 06/29/2021   T3FREE 3.2 12/22/2020   T3FREE 3.1 06/23/2020     Allergies as of 07/01/2021       Reactions   Latex Swelling        Medication List        Accurate as of July 01, 2021  1:33 PM. If you have any questions, ask your nurse or doctor.          latanoprost 0.005 % ophthalmic solution Commonly known as: XALATAN 1 drop at bedtime.   phentermine 15 MG capsule Take 1 capsule (15 mg total) by mouth daily.   timolol 0.5 % ophthalmic solution Commonly known as: TIMOPTIC 1 drop every morning.   topiramate 50 MG tablet Commonly known as: TOPAMAX TAKE 2 TABLETS BY MOUTH IN  THE MORNING AND 1 TABLET BY MOUTH IN THE EVENING        Allergies:  Allergies  Allergen Reactions   Latex Swelling    Past Medical History:  Diagnosis Date   Anemia    Dysmenorrhea    Glaucoma    Herpes    History of kidney stones    Thyroid nodule    bx 04-2013    Past Surgical History:  Procedure  Laterality Date   BREAST EXCISIONAL BIOPSY Left 2004   BREAST SURGERY     biospy   EXTRACORPOREAL SHOCK WAVE LITHOTRIPSY Left 11/03/2016   Procedure: LEFT EXTRACORPOREAL SHOCK WAVE LITHOTRIPSY (ESWL);  Surgeon: Festus Aloe, MD;  Location: [facility];  Service: Urology;  Laterality: Left;   FOOT SURGERY Bilateral    NOVASURE ABLATION      Family History  Problem Relation Age of Onset   Hypertension Mother        M- brother    Leukemia Mother        deceased   Diabetes Brother    Breast cancer Maternal Aunt        aunt-cousin   Hypothyroidism Sister        had thyroid surgery   Breast cancer Cousin    HIV Son    Colon cancer Neg Hx    CAD Neg Hx     Social History:  reports that she has never smoked. She has never used smokeless tobacco. She reports that she does not drink alcohol and does not use drugs.  Review of Systems   No history of hyperlipidemia  Lab Results   Component Value Date   CHOL 180 06/12/2019   HDL 78.10 06/12/2019   LDLCALC 90 06/12/2019   TRIG 56.0 06/12/2019   CHOLHDL 2 06/12/2019   She previously had low potassium levels of unclear etiology and these are now consistently normal     LABS:  Lab on 06/29/2021  Component Date Value Ref Range Status   T3, Free 06/29/2021 2.7  2.3 - 4.2 pg/mL Final   Free T4 06/29/2021 1.12  0.60 - 1.60 ng/dL Final   Comment: Specimens from patients who are undergoing biotin therapy and /or ingesting biotin supplements may contain high levels of biotin.  The higher biotin concentration in these specimens interferes with this Free T4 assay.  Specimens that contain high levels  of biotin may cause false high results for this Free T4 assay.  Please interpret results in light of the total clinical presentation of the patient.     TSH 06/29/2021 0.30 (A) 0.35 - 5.50 uIU/mL Final   Sodium 06/29/2021 140  135 - 145 mEq/L Final   Potassium 06/29/2021 4.2  3.5 - 5.1 mEq/L Final   Chloride 06/29/2021 110  96 - 112 mEq/L Final   CO2 06/29/2021 22  19 - 32 mEq/L Final   Glucose, Bld 06/29/2021 79  70 - 99 mg/dL Final   BUN 06/29/2021 11  6 - 23 mg/dL Final   Creatinine, Ser 06/29/2021 0.69  0.40 - 1.20 mg/dL Final   GFR 06/29/2021 94.87  >60.00 mL/min Final   Calculated using the CKD-EPI Creatinine Equation (2021)   Calcium 06/29/2021 10.0  8.4 - 10.5 mg/dL Final    EXAM:  BP 130/80   Pulse 84   Ht 5' 5.5" (1.664 m)   Wt 153 lb 3.2 oz (69.5 kg)   BMI 25.11 kg/m    She has a visible THYROID nodule in the isthmus  This is a smooth nodule measures 2.8-3 cm with relatively firm texture Has a fleshy 1-1/2 times enlargement of the right lobe   Left lobe not palpable  No neck lymphadenopathy  Biceps reflexes show normal relaxation  Assessment/Plan:   Obesity with baseline BMI 33:   She is on combination of low-dose phentermine  15 mg and a total of 150 mg Topamax Has not had any side effects  taking both medications long-term and  prefers relatively higher dose of Topamax Recently has lost another 5 pounds although this is partly from stress She is a little more active now  She will stay on the same regimen including a total of 150 mg of Topamax a day Prescriptions to be sent today  THYROID nodule with persistently low TSH: She has a stable 3 cm midline nodule  with no change in size on exam for quite some time Has a mild right lobe enlargement as before  As before she has a mildly TSH without increased T4 and T3  Discussed that she likely has an autonomous area in her thyroid in the area of nodule Continue observation every 6 months   Iran Kievit 07/01/2021, 1:33 PM

## 2021-07-02 ENCOUNTER — Other Ambulatory Visit: Payer: Self-pay | Admitting: Endocrinology

## 2021-07-02 ENCOUNTER — Ambulatory Visit: Payer: 59 | Admitting: Endocrinology

## 2021-07-02 MED ORDER — PHENTERMINE HCL 15 MG PO CAPS: 15.0000 mg | ORAL_CAPSULE | Freq: Every day | ORAL | 5 refills | Status: DC

## 2021-12-29 ENCOUNTER — Other Ambulatory Visit: Payer: Self-pay

## 2021-12-29 ENCOUNTER — Other Ambulatory Visit: Payer: 59

## 2021-12-29 ENCOUNTER — Other Ambulatory Visit (INDEPENDENT_AMBULATORY_CARE_PROVIDER_SITE_OTHER): Payer: 59

## 2021-12-29 DIAGNOSIS — R7989 Other specified abnormal findings of blood chemistry: Secondary | ICD-10-CM | POA: Diagnosis not present

## 2021-12-29 DIAGNOSIS — E041 Nontoxic single thyroid nodule: Secondary | ICD-10-CM

## 2021-12-29 DIAGNOSIS — Z8639 Personal history of other endocrine, nutritional and metabolic disease: Secondary | ICD-10-CM

## 2021-12-29 DIAGNOSIS — Z13220 Encounter for screening for lipoid disorders: Secondary | ICD-10-CM

## 2021-12-29 LAB — LIPID PANEL
Cholesterol: 212 mg/dL — ABNORMAL HIGH (ref 0–200)
HDL: 80.1 mg/dL (ref >39.00)
LDL Cholesterol: 123 mg/dL — ABNORMAL HIGH (ref 0–99)
NonHDL: 132.19
Total CHOL/HDL Ratio: 3
Triglycerides: 46 mg/dL (ref 0.0–149.0)
VLDL: 9.2 mg/dL (ref 0.0–40.0)

## 2021-12-29 LAB — BASIC METABOLIC PANEL
BUN: 13 mg/dL (ref 6–23)
CO2: 21 mEq/L (ref 19–32)
Calcium: 10.1 mg/dL (ref 8.4–10.5)
Chloride: 112 mEq/L (ref 96–112)
Creatinine, Ser: 0.72 mg/dL (ref 0.40–1.20)
GFR: 91.07 mL/min (ref >60.00)
Glucose, Bld: 76 mg/dL (ref 70–99)
Potassium: 4.1 mEq/L (ref 3.5–5.1)
Sodium: 139 mEq/L (ref 135–145)

## 2021-12-29 LAB — T3, FREE: T3, Free: 3.3 pg/mL (ref 2.3–4.2)

## 2021-12-29 LAB — T4, FREE: Free T4: 1.16 ng/dL (ref 0.60–1.60)

## 2021-12-29 LAB — TSH: TSH: 0.24 u[IU]/mL — ABNORMAL LOW (ref 0.35–5.50)

## 2021-12-31 ENCOUNTER — Ambulatory Visit: Payer: 59 | Admitting: Endocrinology

## 2022-01-05 ENCOUNTER — Ambulatory Visit: Payer: 59 | Admitting: Endocrinology

## 2022-01-12 ENCOUNTER — Other Ambulatory Visit: Payer: Self-pay | Admitting: Endocrinology

## 2022-01-20 ENCOUNTER — Ambulatory Visit: Payer: 59 | Admitting: Endocrinology

## 2022-01-20 ENCOUNTER — Encounter: Payer: Self-pay | Admitting: Endocrinology

## 2022-01-20 VITALS — BP 136/78 | HR 80 | Ht 65.5 in | Wt 165.2 lb

## 2022-01-20 DIAGNOSIS — E041 Nontoxic single thyroid nodule: Secondary | ICD-10-CM

## 2022-01-20 DIAGNOSIS — Z8639 Personal history of other endocrine, nutritional and metabolic disease: Secondary | ICD-10-CM | POA: Diagnosis not present

## 2022-01-20 DIAGNOSIS — Z131 Encounter for screening for diabetes mellitus: Secondary | ICD-10-CM | POA: Diagnosis not present

## 2022-01-20 DIAGNOSIS — E7800 Pure hypercholesterolemia, unspecified: Secondary | ICD-10-CM

## 2022-01-20 DIAGNOSIS — R7989 Other specified abnormal findings of blood chemistry: Secondary | ICD-10-CM

## 2022-01-20 NOTE — Progress Notes (Signed)
? ? ?Kathleen Burton 61 y.o. ? ? ?Chief complaint: Followup of thyroid and weight management   ? ?History of Present Illness:  ? ?Problem 1: Weight management ? ?Because of a weight gain of about 15 pounds and difficulty with losing weight with diet and exercise regimen she wanted weight loss medications.  ?She was asked to try Qsymia but because of lack of coverage she was given the equivalent doses in generic form ? ?She has not had any formal dietitian consultation ? ?Recent history: ?She has been treated long-term with phentermine  15 mg daily and Topamax  50 mg, 2 in the morning and 1 in the evening ?With this she had improved satiety and had significant weight loss when the regimen was started ? ?She prefers a total of 3 tablets of Topamax 50 mg daily instead 2 tablets daily ?Has been taking phentermine 15 mg in the morning once daily ?No side effects with this regimen such as nervousness, nausea or palpitations ? ?Weight is up 12 pounds which she is doing this is because of inactivity with her hip pain and recently because of long working hours eating more fast food and not planning her meals ? ? ?Wt Readings from Last 3 Encounters:  ?01/20/22 165 lb 3.2 oz (74.9 kg)  ?07/01/21 153 lb 3.2 oz (69.5 kg)  ?12/25/20 158 lb (71.7 kg)  ? ? ?Problem 2: ? ?Thyroid nodule: This was biopsied in 04/2013 and was found to be benign. ?Previous ultrasound had shown partially cystic nodule in the midline, measuring 2.4 cm in 2014 ? ?This has measured about 3 cm consistently on previous exams ?Nodule is palpable in the isthmus area ?Do not feel any pressure sensation in her neck ? ?She usually has a low but not undetectable TSH without high free T4 or free T3 ?TSH is again slightly below normal  ?As before free T4 is normal along with free T3 ? ?She has not had a nuclear thyroid scan  ? ? ?Lab Results  ?Component Value Date  ? TSH 0.24 (L) 12/29/2021  ? TSH 0.30 (L) 06/29/2021  ? TSH 0.22 (L) 12/22/2020  ? FREET4  1.16 12/29/2021  ? FREET4 1.12 06/29/2021  ? FREET4 0.95 12/22/2020  ? ?Lab Results  ?Component Value Date  ? T3FREE 3.3 12/29/2021  ? T3FREE 2.7 06/29/2021  ? T3FREE 3.2 12/22/2020  ? ? ? ?Allergies as of 01/20/2022   ? ?   Reactions  ? Latex Swelling  ? ?  ? ?  ?Medication List  ?  ? ?  ? Accurate as of January 20, 2022  4:47 PM. If you have any questions, ask your nurse or doctor.  ?  ?  ? ?  ? ?latanoprost 0.005 % ophthalmic solution ?Commonly known as: XALATAN ?1 drop at bedtime. ?  ?meloxicam 15 MG tablet ?Commonly known as: MOBIC ?Take 15 mg by mouth daily. ?  ?phentermine 15 MG capsule ?TAKE ONE CAPSULE BY MOUTH DAILY ?  ?timolol 0.5 % ophthalmic solution ?Commonly known as: TIMOPTIC ?1 drop every morning. ?  ?topiramate 50 MG tablet ?Commonly known as: TOPAMAX ?TAKE 2 TABLETS BY MOUTH IN  THE MORNING AND 1 TABLET BY MOUTH IN THE EVENING ?  ? ?  ? ? ?Allergies:  ?Allergies  ?Allergen Reactions  ? Latex Swelling  ? ? ?Past Medical History:  ?Diagnosis Date  ? Anemia   ? Dysmenorrhea   ? Glaucoma   ? Herpes   ? History of kidney stones   ?  Thyroid nodule   ? bx 04-2013  ? ? ?Past Surgical History:  ?Procedure Laterality Date  ? BREAST EXCISIONAL BIOPSY Left 2004  ? BREAST SURGERY    ? biospy  ? EXTRACORPOREAL SHOCK WAVE LITHOTRIPSY Left 11/03/2016  ? Procedure: LEFT EXTRACORPOREAL SHOCK WAVE LITHOTRIPSY (ESWL);  Surgeon: Festus Aloe, MD;  Location: [facility];  Service: Urology;  Laterality: Left;  ? FOOT SURGERY Bilateral   ? NOVASURE ABLATION    ? ? ?Family History  ?Problem Relation Age of Onset  ? Hypertension Mother   ?     M- brother   ? Leukemia Mother   ?     deceased  ? Diabetes Brother   ? Breast cancer Maternal Aunt   ?     aunt-cousin  ? Hypothyroidism Sister   ?     had thyroid surgery  ? Breast cancer Cousin   ? HIV Son   ? Colon cancer Neg Hx   ? CAD Neg Hx   ? ? ?Social History:  reports that she has never smoked. She has never used smokeless tobacco. She reports that she does not drink alcohol and  does not use drugs. ? ?Review of Systems  ? ?No history of hyperlipidemia but her LDL is relatively higher than before ?She says she has been eating more fast food and also has gained weight ? ?Lab Results  ?Component Value Date  ? CHOL 212 (H) 12/29/2021  ? HDL 80.10 12/29/2021  ? LDLCALC 123 (H) 12/29/2021  ? TRIG 46.0 12/29/2021  ? CHOLHDL 3 12/29/2021  ? ? ?No history of hypertension ?  ? ?BP Readings from Last 3 Encounters:  ?01/20/22 136/78  ?07/01/21 130/80  ?12/25/20 120/82  ? ? ? ?LABS: ? ?No visits with results within 1 Week(s) from this visit.  ?Latest known visit with results is:  ?Lab on 12/29/2021  ?Component Date Value Ref Range Status  ? Sodium 12/29/2021 139  135 - 145 mEq/L Final  ? Potassium 12/29/2021 4.1  3.5 - 5.1 mEq/L Final  ? Chloride 12/29/2021 112  96 - 112 mEq/L Final  ? CO2 12/29/2021 21  19 - 32 mEq/L Final  ? Glucose, Bld 12/29/2021 76  70 - 99 mg/dL Final  ? BUN 12/29/2021 13  6 - 23 mg/dL Final  ? Creatinine, Ser 12/29/2021 0.72  0.40 - 1.20 mg/dL Final  ? GFR 12/29/2021 91.07  >60.00 mL/min Final  ? Calculated using the CKD-EPI Creatinine Equation (2021)  ? Calcium 12/29/2021 10.1  8.4 - 10.5 mg/dL Final  ? Cholesterol 12/29/2021 212 (H)  0 - 200 mg/dL Final  ? ATP III Classification       Desirable:  < 200 mg/dL               Borderline High:  200 - 239 mg/dL          High:  > = 240 mg/dL  ? Triglycerides 12/29/2021 46.0  0.0 - 149.0 mg/dL Final  ? Normal:  <150 mg/dLBorderline High:  150 - 199 mg/dL  ? HDL 12/29/2021 80.10  >39.00 mg/dL Final  ? VLDL 12/29/2021 9.2  0.0 - 40.0 mg/dL Final  ? LDL Cholesterol 12/29/2021 123 (H)  0 - 99 mg/dL Final  ? Total CHOL/HDL Ratio 12/29/2021 3   Final  ?                Men          Women1/2 Average Risk  3.4          3.3Average Risk          5.0          4.42X Average Risk          9.6          7.13X Average Risk          15.0          11.0                      ? NonHDL 12/29/2021 132.19   Final  ? NOTE:  Non-HDL goal should be 30 mg/dL  higher than patient's LDL goal (i.e. LDL goal of < 70 mg/dL, would have non-HDL goal of < 100 mg/dL)  ? T3, Free 12/29/2021 3.3  2.3 - 4.2 pg/mL Final  ? Free T4 12/29/2021 1.16  0.60 - 1.60 ng/dL Final  ? Comment: Specimens from patients who are undergoing biotin therapy and /or ingesting biotin supplements may contain high levels of biotin.  The higher biotin concentration in these specimens interferes with this Free T4 assay.  Specimens that contain high levels  ?of biotin may cause false high results for this Free T4 assay.  Please interpret results in light of the total clinical presentation of the patient.  ?  ? TSH 12/29/2021 0.24 (L)  0.35 - 5.50 uIU/mL Final  ? ? ?EXAM: ? ?BP 136/78   Pulse 80   Ht 5' 5.5" (1.664 m)   Wt 165 lb 3.2 oz (74.9 kg)   SpO2 99%   BMI 27.07 kg/m?  ? ? ?There is a smooth nodule in the midline measuring about 2.5-3 cm, smooth and relatively firm ? ?Has a fleshy 1-1/2 times enlargement of the right lobe without a nodule ? ?Left lobe not palpable ? ?Biceps reflexes show normal relaxation ? ?Assessment/Plan:  ? ?Obesity with baseline BMI 33:  ? ?She is on combination of low-dose phentermine  15 mg and a total of 150 mg Topamax ?She has maintained her medication regimen ?However she has gained a significant amount of weight from poor diet and lack of exercise now ? ?Discussed that she needs to start planning her meals and avoiding fast food ?She will need to also try and get her hip surgery done as soon as possible to allow exercise ? ?THYROID nodule with persistently low TSH: ?She has a stable 3 cm midline nodule   ?This is unchanged on exam ?Has a small right lobe enlargement as before also ? ?Has a low but not undetectable TSH without increased T4 and T3  ? ?HYPERCHOLESTEROLEMIA: This is mild but LDL is significantly higher than usual and likely related to her poor dietary choices as discussed above and she is going to start modifying this ? ?Continue observation every 6  months ? ? ?[name] ?01/20/2022, 4:47 PM  ? ? ?

## 2022-02-25 ENCOUNTER — Encounter: Payer: Self-pay | Admitting: Internal Medicine

## 2022-05-05 ENCOUNTER — Other Ambulatory Visit: Payer: Self-pay | Admitting: Endocrinology

## 2022-05-12 ENCOUNTER — Other Ambulatory Visit: Payer: Self-pay | Admitting: Endocrinology

## 2022-05-13 ENCOUNTER — Other Ambulatory Visit: Payer: Self-pay

## 2022-05-13 ENCOUNTER — Other Ambulatory Visit: Payer: Self-pay | Admitting: Endocrinology

## 2022-05-13 ENCOUNTER — Telehealth: Payer: Self-pay

## 2022-05-13 MED ORDER — PHENTERMINE HCL 15 MG PO CAPS: 15.0000 mg | ORAL_CAPSULE | Freq: Every day | ORAL | 5 refills | Status: DC

## 2022-05-13 MED ORDER — PHENTERMINE HCL 15 MG PO CAPS: 15.0000 mg | ORAL_CAPSULE | Freq: Every day | ORAL | 2 refills | Status: DC

## 2022-05-13 NOTE — Telephone Encounter (Signed)
Please send Rx in for phentermine for patient to [facility]

## 2022-07-19 ENCOUNTER — Other Ambulatory Visit (INDEPENDENT_AMBULATORY_CARE_PROVIDER_SITE_OTHER): Payer: 59

## 2022-07-19 DIAGNOSIS — E7800 Pure hypercholesterolemia, unspecified: Secondary | ICD-10-CM | POA: Diagnosis not present

## 2022-07-19 DIAGNOSIS — R7989 Other specified abnormal findings of blood chemistry: Secondary | ICD-10-CM | POA: Diagnosis not present

## 2022-07-19 DIAGNOSIS — Z131 Encounter for screening for diabetes mellitus: Secondary | ICD-10-CM

## 2022-07-19 DIAGNOSIS — E041 Nontoxic single thyroid nodule: Secondary | ICD-10-CM

## 2022-07-19 LAB — T3, FREE: T3, Free: 3 pg/mL (ref 2.3–4.2)

## 2022-07-19 LAB — T4, FREE: Free T4: 0.86 ng/dL (ref 0.60–1.60)

## 2022-07-19 LAB — LIPID PANEL
Cholesterol: 166 mg/dL (ref 0–200)
HDL: 62 mg/dL (ref >39.00)
LDL Cholesterol: 88 mg/dL (ref 0–99)
NonHDL: 104.38
Total CHOL/HDL Ratio: 3
Triglycerides: 83 mg/dL (ref 0.0–149.0)
VLDL: 16.6 mg/dL (ref 0.0–40.0)

## 2022-07-19 LAB — GLUCOSE, RANDOM: Glucose, Bld: 89 mg/dL (ref 70–99)

## 2022-07-19 LAB — TSH: TSH: 0.47 u[IU]/mL (ref 0.35–5.50)

## 2022-07-20 ENCOUNTER — Other Ambulatory Visit: Payer: 59

## 2022-07-21 ENCOUNTER — Ambulatory Visit: Payer: 59 | Admitting: Endocrinology

## 2022-07-21 ENCOUNTER — Encounter: Payer: Self-pay | Admitting: Endocrinology

## 2022-07-21 VITALS — BP 142/82 | HR 63 | Ht 65.5 in | Wt 159.4 lb

## 2022-07-21 DIAGNOSIS — E7800 Pure hypercholesterolemia, unspecified: Secondary | ICD-10-CM | POA: Diagnosis not present

## 2022-07-21 DIAGNOSIS — R7989 Other specified abnormal findings of blood chemistry: Secondary | ICD-10-CM | POA: Diagnosis not present

## 2022-07-21 DIAGNOSIS — E041 Nontoxic single thyroid nodule: Secondary | ICD-10-CM

## 2022-07-21 DIAGNOSIS — Z8639 Personal history of other endocrine, nutritional and metabolic disease: Secondary | ICD-10-CM | POA: Diagnosis not present

## 2022-07-21 NOTE — Progress Notes (Signed)
Kathleen Burton 61 y.o.   Chief complaint: Followup of thyroid and weight management    History of Present Illness:   Problem 1: Weight management  Because of a weight gain of about 15 pounds and difficulty with losing weight with diet and exercise regimen she wanted weight loss medications.  She was asked to try Qsymia but because of lack of coverage she was given the equivalent doses in generic form  She has not had any formal dietitian consultation  Recent history: She has been treated long-term with phentermine  15 mg daily and Topamax  50 mg, 2 in the morning and 1 in the evening With this she had improved satiety and had significant weight loss when the regimen was started  She prefers a total of 3 tablets of Topamax 50 mg daily instead 2 tablets daily Has been also continuing phentermine 15 mg in the morning once daily No side effects with this regimen including rapid heart rate, nausea or drowsiness  Her last visit 6 months ago she had gained 12 pounds However she is usually trying to watch her diet better Because of her hip pain has not been able to do any walking or exercise bike With this her weight is down 6 pounds  Wt Readings from Last 3 Encounters:  07/21/22 159 lb 6.4 oz (72.3 kg)  01/20/22 165 lb 3.2 oz (74.9 kg)  07/01/21 153 lb 3.2 oz (69.5 kg)    Problem 2:  Thyroid nodule: This was biopsied in 04/2013 and was found to be benign. Previous ultrasound had shown partially cystic nodule in the midline, measuring 2.4 cm in 2014  This has measured about 3 cm consistently on previous exams Nodule is visible and palpable in the isthmus area Do not feel any choking feeling or has difficulty swallowing  She usually has a low but not undetectable TSH without high free T4 or free T3 TSH is now low normal with slight decrease in T4 compared to the last visit T3 also normal  She has not had a nuclear thyroid scan    Lab Results  Component Value  Date   TSH 0.47 07/19/2022   TSH 0.24 (L) 12/29/2021   TSH 0.30 (L) 06/29/2021   FREET4 0.86 07/19/2022   FREET4 1.16 12/29/2021   FREET4 1.12 06/29/2021   Lab Results  Component Value Date   T3FREE 3.0 07/19/2022   T3FREE 3.3 12/29/2021   T3FREE 2.7 06/29/2021     Allergies as of 07/21/2022       Reactions   Latex Swelling        Medication List        Accurate as of July 21, 2022  3:59 PM. If you have any questions, ask your nurse or doctor.          latanoprost 0.005 % ophthalmic solution Commonly known as: XALATAN 1 drop at bedtime.   meloxicam 15 MG tablet Commonly known as: MOBIC Take 15 mg by mouth daily.   phentermine 15 MG capsule Take 1 capsule (15 mg total) by mouth daily.   timolol 0.5 % ophthalmic solution Commonly known as: TIMOPTIC 1 drop every morning.   topiramate 50 MG tablet Commonly known as: TOPAMAX TAKE 2 TABLETS BY MOUTH IN  THE MORNING AND 1 TABLET BY MOUTH IN THE EVENING        Allergies:  Allergies  Allergen Reactions   Latex Swelling    Past Medical History:  Diagnosis Date   Anemia  Dysmenorrhea    Glaucoma    Herpes    History of kidney stones    Thyroid nodule    bx 04-2013    Past Surgical History:  Procedure Laterality Date   BREAST EXCISIONAL BIOPSY Left 2004   BREAST SURGERY     biospy   EXTRACORPOREAL SHOCK WAVE LITHOTRIPSY Left 11/03/2016   Procedure: LEFT EXTRACORPOREAL SHOCK WAVE LITHOTRIPSY (ESWL);  Surgeon: Festus Aloe, MD;  Location: [facility];  Service: Urology;  Laterality: Left;   FOOT SURGERY Bilateral    NOVASURE ABLATION      Family History  Problem Relation Age of Onset   Hypertension Mother        M- brother    Leukemia Mother        deceased   Diabetes Brother    Breast cancer Maternal Aunt        aunt-cousin   Hypothyroidism Sister        had thyroid surgery   Breast cancer Cousin    HIV Son    Colon cancer Neg Hx    CAD Neg Hx     Social History:  reports that  she has never smoked. She has never used smokeless tobacco. She reports that she does not drink alcohol and does not use drugs.  Review of Systems   No history of hyperlipidemia but her LDL is relatively higher than before She says she has been cutting out fast food and generally watching her diet with improvement in LDL   Lab Results  Component Value Date   CHOL 166 07/19/2022   HDL 62.00 07/19/2022   LDLCALC 88 07/19/2022   TRIG 83.0 07/19/2022   CHOLHDL 3 07/19/2022    No history of hypertension    BP Readings from Last 3 Encounters:  07/21/22 (!) 142/82  01/20/22 136/78  07/01/21 130/80     LABS:  Lab on 07/19/2022  Component Date Value Ref Range Status   T3, Free 07/19/2022 3.0  2.3 - 4.2 pg/mL Final   Free T4 07/19/2022 0.86  0.60 - 1.60 ng/dL Final   Comment: Specimens from patients who are undergoing biotin therapy and /or ingesting biotin supplements may contain high levels of biotin.  The higher biotin concentration in these specimens interferes with this Free T4 assay.  Specimens that contain high levels  of biotin may cause false high results for this Free T4 assay.  Please interpret results in light of the total clinical presentation of the patient.     TSH 07/19/2022 0.47  0.35 - 5.50 uIU/mL Final   Glucose, Bld 07/19/2022 89  70 - 99 mg/dL Final   Cholesterol 07/19/2022 166  0 - 200 mg/dL Final   ATP III Classification       Desirable:  < 200 mg/dL               Borderline High:  200 - 239 mg/dL          High:  > = 240 mg/dL   Triglycerides 07/19/2022 83.0  0.0 - 149.0 mg/dL Final   Normal:  <150 mg/dLBorderline High:  150 - 199 mg/dL   HDL 07/19/2022 62.00  >39.00 mg/dL Final   VLDL 07/19/2022 16.6  0.0 - 40.0 mg/dL Final   LDL Cholesterol 07/19/2022 88  0 - 99 mg/dL Final   Total CHOL/HDL Ratio 07/19/2022 3   Final                  Men  Women1/2 Average Risk     3.4          3.3Average Risk          5.0          4.42X Average Risk          9.6           7.13X Average Risk          15.0          11.0                       NonHDL 07/19/2022 104.38   Final   NOTE:  Non-HDL goal should be 30 mg/dL higher than patient's LDL goal (i.e. LDL goal of < 70 mg/dL, would have non-HDL goal of < 100 mg/dL)    EXAM:  BP (!) 142/82   Pulse (!) 6   Ht 5' 5.5" (1.664 m)   Wt 159 lb 6.4 oz (72.3 kg)   BMI 26.12 kg/m    There is a smooth nodule in the midline measuring about 2.8 cm, smooth and firm  Also has a 1-1/2 times enlargement of the right lobe, relatively soft  Left lobe not palpable  Biceps reflexes show normal relaxation  Assessment/Plan:   Obesity with baseline BMI 33:   She is on combination of low-dose phentermine  15 mg and a total of 150 mg Topamax She has been doing well with using her her medication regimen long-term with maintenance of her weight with some fluctuation  Since her last visit with trying to do better on her diet with reduced calories and snacks she has lost 6 pounds Currently not exercising She was asking about Ozempic but currently without any abnormal glucose levels she will not be able to get this approved and also discussed that with her BMI of 26 she has had adequate weight loss  THYROID nodule with history of low TSH indicating autonomous function: She has a stable midline nodule which peers to be slightly less than 3 cm today This is unchanged on exam  Has a low normal TSH without increased T4 and T3   HYPERCHOLESTEROLEMIA: This is improved likely from better diet  Continue follow-up every 6 months   Lonnel Gjerde 07/21/2022, 3:59 PM

## 2022-07-28 ENCOUNTER — Ambulatory Visit: Payer: 59 | Admitting: Endocrinology

## 2022-09-13 ENCOUNTER — Other Ambulatory Visit: Payer: Self-pay | Admitting: Obstetrics and Gynecology

## 2022-09-13 DIAGNOSIS — Z1231 Encounter for screening mammogram for malignant neoplasm of breast: Secondary | ICD-10-CM

## 2022-09-14 ENCOUNTER — Ambulatory Visit
Admission: RE | Admit: 2022-09-14 | Discharge: 2022-09-14 | Disposition: A | Discharge status: Home / Self Care | Payer: 59 | Source: Ambulatory Visit | Attending: Obstetrics and Gynecology | Admitting: Obstetrics and Gynecology

## 2022-09-14 DIAGNOSIS — Z1231 Encounter for screening mammogram for malignant neoplasm of breast: Secondary | ICD-10-CM

## 2022-11-07 ENCOUNTER — Telehealth: Payer: Self-pay

## 2022-11-07 ENCOUNTER — Other Ambulatory Visit: Payer: Self-pay | Admitting: Endocrinology

## 2022-11-07 MED ORDER — PHENTERMINE HCL 15 MG PO CAPS: 15.0000 mg | ORAL_CAPSULE | Freq: Every day | ORAL | 2 refills | Status: DC

## 2022-11-07 NOTE — Telephone Encounter (Signed)
Patient needs refill on phentermine. If Ok please send. I did try to send but its controlled

## 2022-11-08 ENCOUNTER — Encounter: Payer: Self-pay | Admitting: Internal Medicine

## 2022-11-10 ENCOUNTER — Other Ambulatory Visit: Payer: Self-pay | Admitting: Obstetrics and Gynecology

## 2022-11-10 DIAGNOSIS — Z13820 Encounter for screening for osteoporosis: Secondary | ICD-10-CM

## 2022-11-11 ENCOUNTER — Other Ambulatory Visit: Payer: Self-pay | Admitting: Obstetrics and Gynecology

## 2022-11-11 DIAGNOSIS — Z13820 Encounter for screening for osteoporosis: Secondary | ICD-10-CM

## 2022-11-11 DIAGNOSIS — E2839 Other primary ovarian failure: Secondary | ICD-10-CM

## 2022-11-14 ENCOUNTER — Ambulatory Visit
Admission: RE | Admit: 2022-11-14 | Discharge: 2022-11-14 | Disposition: A | Discharge status: Home / Self Care | Payer: 59 | Source: Ambulatory Visit | Attending: Obstetrics and Gynecology | Admitting: Obstetrics and Gynecology

## 2022-11-14 DIAGNOSIS — Z13820 Encounter for screening for osteoporosis: Secondary | ICD-10-CM

## 2023-01-17 ENCOUNTER — Other Ambulatory Visit (INDEPENDENT_AMBULATORY_CARE_PROVIDER_SITE_OTHER): Payer: 59

## 2023-01-17 DIAGNOSIS — E041 Nontoxic single thyroid nodule: Secondary | ICD-10-CM

## 2023-01-17 DIAGNOSIS — R7989 Other specified abnormal findings of blood chemistry: Secondary | ICD-10-CM

## 2023-01-17 DIAGNOSIS — E7800 Pure hypercholesterolemia, unspecified: Secondary | ICD-10-CM | POA: Diagnosis not present

## 2023-01-17 LAB — T3, FREE: T3, Free: 2.6 pg/mL (ref 2.3–4.2)

## 2023-01-17 LAB — TSH: TSH: 0.36 u[IU]/mL (ref 0.35–5.50)

## 2023-01-17 LAB — LDL CHOLESTEROL, DIRECT: Direct LDL: 93 mg/dL

## 2023-01-17 LAB — T4, FREE: Free T4: 0.9 ng/dL (ref 0.60–1.60)

## 2023-01-20 ENCOUNTER — Encounter: Payer: Self-pay | Admitting: Endocrinology

## 2023-01-20 ENCOUNTER — Ambulatory Visit: Payer: 59 | Admitting: Endocrinology

## 2023-01-20 VITALS — BP 108/68 | HR 67 | Ht 65.5 in | Wt 163.0 lb

## 2023-01-20 DIAGNOSIS — E041 Nontoxic single thyroid nodule: Secondary | ICD-10-CM | POA: Diagnosis not present

## 2023-01-20 DIAGNOSIS — R7989 Other specified abnormal findings of blood chemistry: Secondary | ICD-10-CM | POA: Diagnosis not present

## 2023-01-20 DIAGNOSIS — E7800 Pure hypercholesterolemia, unspecified: Secondary | ICD-10-CM

## 2023-01-20 DIAGNOSIS — Z8639 Personal history of other endocrine, nutritional and metabolic disease: Secondary | ICD-10-CM

## 2023-01-20 MED ORDER — QSYMIA 15-92 MG PO CP24: ORAL_CAPSULE | ORAL | 3 refills | Status: DC

## 2023-01-20 NOTE — Progress Notes (Signed)
Kathleen Burton 62 y.o.   Chief complaint: Followup of thyroid and weight management    History of Present Illness:   Problem 1: Weight management  Because of a weight gain of about 15 pounds and difficulty with losing weight with diet and exercise regimen she wanted weight loss medications.  She was asked to try Qsymia but because of lack of coverage she was given the equivalent doses in generic form  Recent history: She has been treated long-term with phentermine  15 mg daily and Topamax  50 mg, 2 in the morning and 1 in the evening With this she had improved satiety and had significant weight loss when the regimen was started  She prefers a total of 3 tablets of Topamax 50 mg daily instead 2 tablets daily Also takes phentermine 15 mg in the morning once daily No side effects with this regimen such as rapid heart rate, nausea or drowsiness  She is very consistently trying to keep a low-fat diet and avoid excessive portions or carbohydrates  Because of her hip pain has not been able to do any walking or exercise bike  She says she is having marked weight gain although appears to have had only 4 pound weight gain since last October.  She is concerned that she is getting more weight gain because of menopausal symptoms as discussed with her gynecologist and wants a more strong weight loss medication  Wt Readings from Last 3 Encounters:  01/20/23 163 lb (73.9 kg)  07/21/22 159 lb 6.4 oz (72.3 kg)  01/20/22 165 lb 3.2 oz (74.9 kg)    Problem 2:  Thyroid nodule: This was biopsied in 04/2013 and was found to be benign. Previous ultrasound had shown partially cystic nodule in the midline, measuring 2.4 cm in 2014  This has measured about 3 cm consistently on previous exams Nodule is visible and palpable in the isthmus area Does not have any local pressure symptoms  She usually has a relatively low but not undetectable TSH without high free T4 or free T3 TSH is  again low normal with no change in free T4  T3 also normal  She has not had a nuclear thyroid scan    Lab Results  Component Value Date   TSH 0.36 01/17/2023   TSH 0.47 07/19/2022   TSH 0.24 (L) 12/29/2021   FREET4 0.90 01/17/2023   FREET4 0.86 07/19/2022   FREET4 1.16 12/29/2021   Lab Results  Component Value Date   T3FREE 2.6 01/17/2023   T3FREE 3.0 07/19/2022   T3FREE 3.3 12/29/2021     Allergies as of 01/20/2023       Reactions   Latex Swelling        Medication List        Accurate as of January 20, 2023 11:28 AM. If you have any questions, ask your nurse or doctor.          latanoprost 0.005 % ophthalmic solution Commonly known as: XALATAN 1 drop at bedtime.   meloxicam 15 MG tablet Commonly known as: MOBIC Take 15 mg by mouth daily.   phentermine 15 MG capsule Take 1 capsule (15 mg total) by mouth daily.   Systane Preservative Free 0.4-0.3 % Soln Generic drug: Polyethyl Glyc-Propyl Glyc PF Apply to eye.   timolol 0.5 % ophthalmic solution Commonly known as: TIMOPTIC 1 drop every morning.   topiramate 50 MG tablet Commonly known as: TOPAMAX TAKE 2 TABLETS BY MOUTH IN  THE MORNING AND  1 TABLET BY MOUTH IN THE EVENING        Allergies:  Allergies  Allergen Reactions   Latex Swelling    Past Medical History:  Diagnosis Date   Anemia    Dysmenorrhea    Glaucoma    Herpes    History of kidney stones    Thyroid nodule    bx 04-2013    Past Surgical History:  Procedure Laterality Date   BREAST EXCISIONAL BIOPSY Left 2004   BREAST SURGERY     biospy   EXTRACORPOREAL SHOCK WAVE LITHOTRIPSY Left 11/03/2016   Procedure: LEFT EXTRACORPOREAL SHOCK WAVE LITHOTRIPSY (ESWL);  Surgeon: Jerilee Field, MD;  Location: [facility];  Service: Urology;  Laterality: Left;   FOOT SURGERY Bilateral    NOVASURE ABLATION      Family History  Problem Relation Age of Onset   Hypertension Mother        M- brother    Leukemia Mother        deceased    Diabetes Brother    Breast cancer Maternal Aunt        aunt-cousin   Hypothyroidism Sister        had thyroid surgery   Breast cancer Cousin    HIV Son    Colon cancer Neg Hx    CAD Neg Hx     Social History:  reports that she has never smoked. She has never used smokeless tobacco. She reports that she does not drink alcohol and does not use drugs.  Review of Systems   No history of hyperlipidemia but her LDL is relatively higher than before Not eating fast food and generally watching her diet with improvement in LDL   Lab Results  Component Value Date   CHOL 166 07/19/2022   HDL 62.00 07/19/2022   LDLCALC 88 07/19/2022   LDLDIRECT 93.0 01/17/2023   TRIG 83.0 07/19/2022   CHOLHDL 3 07/19/2022    No history of hypertension    BP Readings from Last 3 Encounters:  01/20/23 108/68  07/21/22 (!) 142/82  01/20/22 136/78   She has had significant insomnia, some night sweats and headaches but does not want to take any medications for sleep or menopausal symptoms, has not discussed with PCP  LABS:  Lab on 01/17/2023  Component Date Value Ref Range Status   Direct LDL 01/17/2023 93.0  mg/dL Final   Optimal:  <387 mg/dLNear or Above Optimal:  100-129 mg/dLBorderline High:  130-159 mg/dLHigh:  160-189 mg/dLVery High:  >190 mg/dL   T3, Free 56/43/3295 2.6  2.3 - 4.2 pg/mL Final   Free T4 01/17/2023 0.90  0.60 - 1.60 ng/dL Final   Comment: Specimens from patients who are undergoing biotin therapy and /or ingesting biotin supplements may contain high levels of biotin.  The higher biotin concentration in these specimens interferes with this Free T4 assay.  Specimens that contain high levels  of biotin may cause false high results for this Free T4 assay.  Please interpret results in light of the total clinical presentation of the patient.     TSH 01/17/2023 0.36  0.35 - 5.50 uIU/mL Final    EXAM:  BP 108/68   Pulse 67   Ht 5' 5.5" (1.664 m)   Wt 163 lb (73.9 kg)   BMI  26.71 kg/m    She has a midline thyroid nodule measuring about 2.8 cm across, smooth and firm  Also has a 1.5-2 times enlargement of the right lobe, relatively soft without nodules  Left lobe not palpable  Biceps reflexes show normal relaxation  Assessment/Plan:   Obesity with baseline BMI 33:   She is on combination of low-dose phentermine  15 mg and a total of 150 mg Topamax She has been continuing the same medication long-term However even though she has gained only 4 pounds she thinks she needs further weight loss with the more effective weight loss medication She is currently not able to exercise Also discussed that because of her significant insomnia she likely has some tendency to weight gain with altered metabolism  Well with using her her medication regimen long-term with maintenance of her weight with some fluctuation Again discussed that with her BMI of 26 she has had adequate weight level and no secondary complications  After much discussion she is agreeing to try brand-name Qsymia 15/92 mg daily to see if this works any better  THYROID nodule with history of relatively low TSH indicating autonomous function: The midline nodule on exam is unchanged as before Overall no change in her goiter  Has a low normal TSH without increased T4 and T3 which is consistently stable  Insomnia: Etiology unclear and she can benefit from seeing a sleep specialist, she will discuss with PCP  History of hypercholesterolemia: Again well-managed with diet alone and LDL 92  Follow-up in 4 months   Liliani Bobo 01/20/2023, 11:28 AM

## 2023-01-23 ENCOUNTER — Telehealth: Payer: Self-pay

## 2023-01-23 NOTE — Telephone Encounter (Signed)
Pt called and requested her rx be switched back to phentermine. The Qsymia was too expensive and pt is unable to fill.

## 2023-01-24 ENCOUNTER — Other Ambulatory Visit: Payer: Self-pay | Admitting: Endocrinology

## 2023-01-24 MED ORDER — PHENTERMINE HCL 15 MG PO CAPS: 15.0000 mg | ORAL_CAPSULE | ORAL | 5 refills | Status: DC

## 2023-01-26 MED ORDER — TOPIRAMATE 50 MG PO TABS: 50.0000 mg | ORAL_TABLET | Freq: Two times a day (BID) | ORAL | 1 refills | Status: DC

## 2023-01-26 NOTE — Telephone Encounter (Signed)
Pt called and requested her rx be switched back to phentermine. The Qsymia was too expensive and pt is unable to fill.  Patient also wants the Topiramate to go  [facility] mail order as well.

## 2023-05-30 ENCOUNTER — Other Ambulatory Visit (INDEPENDENT_AMBULATORY_CARE_PROVIDER_SITE_OTHER): Payer: 59

## 2023-05-30 DIAGNOSIS — E7800 Pure hypercholesterolemia, unspecified: Secondary | ICD-10-CM | POA: Diagnosis not present

## 2023-05-30 DIAGNOSIS — R7989 Other specified abnormal findings of blood chemistry: Secondary | ICD-10-CM | POA: Diagnosis not present

## 2023-05-30 LAB — LIPID PANEL
Cholesterol: 194 mg/dL (ref 0–200)
HDL: 68 mg/dL (ref >39.00)
LDL Cholesterol: 107 mg/dL — ABNORMAL HIGH (ref 0–99)
NonHDL: 126.22
Total CHOL/HDL Ratio: 3
Triglycerides: 97 mg/dL (ref 0.0–149.0)
VLDL: 19.4 mg/dL (ref 0.0–40.0)

## 2023-05-30 LAB — T3, FREE: T3, Free: 2.8 pg/mL (ref 2.3–4.2)

## 2023-05-30 LAB — TSH: TSH: 0.62 u[IU]/mL (ref 0.35–5.50)

## 2023-05-30 LAB — T4, FREE: Free T4: 0.94 ng/dL (ref 0.60–1.60)

## 2023-06-02 ENCOUNTER — Encounter: Payer: Self-pay | Admitting: Endocrinology

## 2023-06-02 ENCOUNTER — Ambulatory Visit: Payer: 59 | Admitting: Endocrinology

## 2023-06-02 VITALS — BP 118/72 | HR 62 | Ht 65.5 in | Wt 157.6 lb

## 2023-06-02 DIAGNOSIS — E041 Nontoxic single thyroid nodule: Secondary | ICD-10-CM

## 2023-06-02 DIAGNOSIS — E7800 Pure hypercholesterolemia, unspecified: Secondary | ICD-10-CM

## 2023-06-02 DIAGNOSIS — Z8639 Personal history of other endocrine, nutritional and metabolic disease: Secondary | ICD-10-CM

## 2023-06-02 MED ORDER — TOPIRAMATE 50 MG PO TABS: 50.0000 mg | ORAL_TABLET | Freq: Three times a day (TID) | ORAL | 1 refills | Status: DC

## 2023-06-02 NOTE — Progress Notes (Signed)
Kathleen Burton 62 y.o.   Chief complaint: Followup of thyroid and weight management    History of Present Illness:   Problem 1: Weight management  On her initial visit because of a weight gain of about 15 pounds and difficulty with losing weight with diet and exercise regimen she wanted weight loss medications.  She was asked to try Qsymia but because of lack of coverage she was given the equivalent doses in generic form  Recent history: She has been treated long-term with phentermine  15 mg daily and Topamax  50 mg, 2 in the morning and 1 in the evening With this she had improved satiety and had significant weight loss when the regimen was started She takes phentermine 15 mg in the morning once daily No side effects with this regimen such as rapid heart rate, nausea or drowsiness  She is usually trying to follow a low-fat diet and avoid excessive portions or carbohydrates  Because of her hip pain has not been able to do any walking or exercise bike However she is trying to do some chair exercises  Her weight has fluctuated based on her compliance with the diet and since April 2024 has done better with controlling portions and types of foods and has lost 6 pounds  Wt Readings from Last 3 Encounters:  06/02/23 157 lb 9.6 oz (71.5 kg)  01/20/23 163 lb (73.9 kg)  07/21/22 159 lb 6.4 oz (72.3 kg)    Problem 2:  Thyroid nodule: This was biopsied in 04/2013 and was found to be benign. Previous ultrasound had shown partially cystic nodule in the midline, measuring 2.4 cm in 2014  This has measured about 3 cm consistently on follow-up exams Nodule is visible and palpable in the isthmus area  She usually has a relatively low but not undetectable TSH without high free T4 or free T3 TSH is more recently low normal and not suppressed  Free T3 and free T4 consistently normal  She has not had a nuclear thyroid scan    Lab Results  Component Value Date   TSH 0.62  05/30/2023   TSH 0.36 01/17/2023   TSH 0.47 07/19/2022   FREET4 0.94 05/30/2023   FREET4 0.90 01/17/2023   FREET4 0.86 07/19/2022   Lab Results  Component Value Date   T3FREE 2.8 05/30/2023   T3FREE 2.6 01/17/2023   T3FREE 3.0 07/19/2022     Allergies as of 06/02/2023       Reactions   Latex Swelling        Medication List        Accurate as of June 02, 2023 10:35 AM. If you have any questions, ask your nurse or doctor.          latanoprost 0.005 % ophthalmic solution Commonly known as: XALATAN 1 drop at bedtime.   meloxicam 15 MG tablet Commonly known as: MOBIC Take 15 mg by mouth daily.   phentermine 15 MG capsule Take 1 capsule (15 mg total) by mouth every morning.   Systane Preservative Free 0.4-0.3 % Soln Generic drug: Polyethyl Glyc-Propyl Glyc PF Apply to eye.   timolol 0.5 % ophthalmic solution Commonly known as: TIMOPTIC 1 drop every morning.   topiramate 50 MG tablet Commonly known as: Topamax Take 1 tablet (50 mg total) by mouth 2 (two) times daily.        Allergies:  Allergies  Allergen Reactions   Latex Swelling    Past Medical History:  Diagnosis Date  Anemia    Dysmenorrhea    Glaucoma    Herpes    History of kidney stones    Thyroid nodule    bx 04-2013    Past Surgical History:  Procedure Laterality Date   BREAST EXCISIONAL BIOPSY Left 2004   BREAST SURGERY     biospy   EXTRACORPOREAL SHOCK WAVE LITHOTRIPSY Left 11/03/2016   Procedure: LEFT EXTRACORPOREAL SHOCK WAVE LITHOTRIPSY (ESWL);  Surgeon: Jerilee Field, MD;  Location: [facility];  Service: Urology;  Laterality: Left;   FOOT SURGERY Bilateral    NOVASURE ABLATION      Family History  Problem Relation Age of Onset   Hypertension Mother        M- brother    Leukemia Mother        deceased   Diabetes Brother    Breast cancer Maternal Aunt        aunt-cousin   Hypothyroidism Sister        had thyroid surgery   Breast cancer Cousin    HIV Son     Colon cancer Neg Hx    CAD Neg Hx     Social History:  reports that she has never smoked. She has never used smokeless tobacco. She reports that she does not drink alcohol and does not use drugs.  Review of Systems   No history of hyperlipidemia but her LDL is relatively higher than before Not eating fast food and generally watching her diet with improvement in LDL   Lab Results  Component Value Date   CHOL 194 05/30/2023   HDL 68.00 05/30/2023   LDLCALC 107 (H) 05/30/2023   LDLDIRECT 93.0 01/17/2023   TRIG 97.0 05/30/2023   CHOLHDL 3 05/30/2023    No history of hypertension    BP Readings from Last 3 Encounters:  06/02/23 118/72  01/20/23 108/68  07/21/22 (!) 142/82   She has had significant insomnia, some night sweats and headaches but does not want to take any medications for sleep or menopausal symptoms, has not discussed with PCP  LABS:  Lab on 05/30/2023  Component Date Value Ref Range Status   Cholesterol 05/30/2023 194  0 - 200 mg/dL Final   ATP III Classification       Desirable:  < 200 mg/dL               Borderline High:  200 - 239 mg/dL          High:  > = 086 mg/dL   Triglycerides 57/84/6962 97.0  0.0 - 149.0 mg/dL Final   Normal:  <952 mg/dLBorderline High:  150 - 199 mg/dL   HDL 84/13/2440 10.27  >39.00 mg/dL Final   VLDL 25/36/6440 19.4  0.0 - 40.0 mg/dL Final   LDL Cholesterol 05/30/2023 107 (H)  0 - 99 mg/dL Final   Total CHOL/HDL Ratio 05/30/2023 3   Final                  Men          Women1/2 Average Risk     3.4          3.3Average Risk          5.0          4.42X Average Risk          9.6          7.13X Average Risk          15.0  11.0                       NonHDL 05/30/2023 126.22   Final   NOTE:  Non-HDL goal should be 30 mg/dL higher than patient's LDL goal (i.e. LDL goal of < 70 mg/dL, would have non-HDL goal of < 100 mg/dL)   T3, Free 16/07/9603 2.8  2.3 - 4.2 pg/mL Final   Free T4 05/30/2023 0.94  0.60 - 1.60 ng/dL Final   Comment:  Specimens from patients who are undergoing biotin therapy and /or ingesting biotin supplements may contain high levels of biotin.  The higher biotin concentration in these specimens interferes with this Free T4 assay.  Specimens that contain high levels  of biotin may cause false high results for this Free T4 assay.  Please interpret results in light of the total clinical presentation of the patient.     TSH 05/30/2023 0.62  0.35 - 5.50 uIU/mL Final    EXAM:  BP 118/72   Pulse 62   Ht 5' 5.5" (1.664 m)   Wt 157 lb 9.6 oz (71.5 kg)   SpO2 96%   BMI 25.83 kg/m    She has a midline thyroid nodule measuring about 3 cm across, smooth and firm  There is a 1.5-2 times enlargement of the right lobe, relatively soft without nodules  Left lobe not palpable  Biceps reflexes show normal relaxation  Assessment/Plan:   Obesity with baseline BMI 33:   She is on combination of low-dose phentermine  15 mg and a total of 150 mg Topamax She has been continuing the same regimen with maintaining her weight loss  Since her last visit she has done better with her diet and has lost weight, again cannot afford brand-name Qsymia She is currently not able to exercise because of hip pain  Currently with BMI of 26 she has had adequate weight level and no other cardiovascular risk factors  She will maintain her same regimen of phentermine and topiramate  THYROID nodule with history of relatively low TSH indicating autonomous function: The midline nodule on exam is unchanged as before Overall no change in her thyroid enlargement  Has a low normal TSH without increased T4 and T3 which is consistently stable   HYPERCHOLESTEROLEMIA: Has only minimally increased LDL and not a candidate for pharmacological treatment Discussed cutting back on animal fats and other saturated fats in the diet  Follow-up in 6 months   Kayci Belleville 06/02/2023, 10:35 AM

## 2023-08-14 ENCOUNTER — Other Ambulatory Visit: Payer: Self-pay | Admitting: Obstetrics and Gynecology

## 2023-08-14 DIAGNOSIS — Z1231 Encounter for screening mammogram for malignant neoplasm of breast: Secondary | ICD-10-CM

## 2023-08-15 ENCOUNTER — Other Ambulatory Visit: Payer: Self-pay

## 2023-08-15 ENCOUNTER — Telehealth: Payer: Self-pay | Admitting: Endocrinology

## 2023-08-15 DIAGNOSIS — E041 Nontoxic single thyroid nodule: Secondary | ICD-10-CM

## 2023-08-15 DIAGNOSIS — Z8639 Personal history of other endocrine, nutritional and metabolic disease: Secondary | ICD-10-CM

## 2023-08-15 DIAGNOSIS — R7989 Other specified abnormal findings of blood chemistry: Secondary | ICD-10-CM

## 2023-08-15 MED ORDER — PHENTERMINE HCL 15 MG PO CAPS: 15.0000 mg | ORAL_CAPSULE | ORAL | 5 refills | Status: DC

## 2023-08-15 NOTE — Telephone Encounter (Signed)
MEDICATION: phentermine 15 MG capsule  PHARMACY:  Karin Golden at [facility] THE PATIENT CONTACTED THEIR PHARMACY?  YES  IS THIS A 90 DAY SUPPLY : NO  IS PATIENT OUT OF MEDICATION: YES  IF NOT; HOW MUCH IS LEFT: 0  LAST APPOINTMENT DATE: @8 /16/2024  NEXT APPOINTMENT DATE:@2 /02/2024  DO WE HAVE YOUR PERMISSION TO LEAVE A DETAILED MESSAGE?: yes, [phone]  OTHER COMMENTS:    **Let patient know to contact pharmacy at the end of the day to make sure medication is ready. **  ** Please notify patient to allow 48-72 hours to process**  **Encourage patient to contact the pharmacy for refills or they can request refills through [facility]**

## 2023-08-16 ENCOUNTER — Other Ambulatory Visit: Payer: Self-pay

## 2023-08-16 NOTE — Telephone Encounter (Signed)
Patient made aware refill request of Phentermine is complete

## 2023-09-19 ENCOUNTER — Ambulatory Visit
Admission: RE | Admit: 2023-09-19 | Discharge: 2023-09-19 | Disposition: A | Discharge status: Home / Self Care | Payer: 59 | Source: Ambulatory Visit | Attending: Obstetrics and Gynecology | Admitting: Obstetrics and Gynecology

## 2023-09-19 DIAGNOSIS — Z1231 Encounter for screening mammogram for malignant neoplasm of breast: Secondary | ICD-10-CM

## 2023-10-17 ENCOUNTER — Telehealth: Payer: Self-pay

## 2023-10-17 NOTE — Telephone Encounter (Signed)
Received surgery clearance request from [facility]- Pt needing R total hip arthroplasty w/ Dr. Linna Caprice, surgery date TBD. Pt already scheduled for 10/30/23.

## 2023-10-30 ENCOUNTER — Ambulatory Visit (INDEPENDENT_AMBULATORY_CARE_PROVIDER_SITE_OTHER): Payer: 59 | Admitting: Internal Medicine

## 2023-10-30 ENCOUNTER — Encounter: Payer: Self-pay | Admitting: Internal Medicine

## 2023-10-30 VITALS — BP 126/76 | HR 57 | Temp 98.0°F | Resp 16 | Ht 65.5 in | Wt 158.2 lb

## 2023-10-30 DIAGNOSIS — Z0000 Encounter for general adult medical examination without abnormal findings: Secondary | ICD-10-CM | POA: Diagnosis not present

## 2023-10-30 DIAGNOSIS — Z23 Encounter for immunization: Secondary | ICD-10-CM | POA: Diagnosis not present

## 2023-10-30 DIAGNOSIS — Z0001 Encounter for general adult medical examination with abnormal findings: Secondary | ICD-10-CM

## 2023-10-30 DIAGNOSIS — Z01818 Encounter for other preprocedural examination: Secondary | ICD-10-CM

## 2023-10-30 DIAGNOSIS — M810 Age-related osteoporosis without current pathological fracture: Secondary | ICD-10-CM

## 2023-10-30 DIAGNOSIS — R7989 Other specified abnormal findings of blood chemistry: Secondary | ICD-10-CM

## 2023-10-30 DIAGNOSIS — M1611 Unilateral primary osteoarthritis, right hip: Secondary | ICD-10-CM

## 2023-10-30 DIAGNOSIS — D649 Anemia, unspecified: Secondary | ICD-10-CM | POA: Diagnosis not present

## 2023-10-30 DIAGNOSIS — E559 Vitamin D deficiency, unspecified: Secondary | ICD-10-CM

## 2023-10-30 LAB — CBC WITH DIFFERENTIAL/PLATELET
Basophils Absolute: 0.1 10*3/uL (ref 0.0–0.1)
Basophils Relative: 1.4 % (ref 0.0–3.0)
Eosinophils Absolute: 0 10*3/uL (ref 0.0–0.7)
Eosinophils Relative: 1.4 % (ref 0.0–5.0)
HCT: 36.8 % (ref 36.0–46.0)
Hemoglobin: 11.8 g/dL — ABNORMAL LOW (ref 12.0–15.0)
Lymphocytes Relative: 30.5 % (ref 12.0–46.0)
Lymphs Abs: 1.1 10*3/uL (ref 0.7–4.0)
MCHC: 32.1 g/dL (ref 30.0–36.0)
MCV: 82.6 fL (ref 78.0–100.0)
Monocytes Absolute: 0.2 10*3/uL (ref 0.1–1.0)
Monocytes Relative: 5.9 % (ref 3.0–12.0)
Neutro Abs: 2.2 10*3/uL (ref 1.4–7.7)
Neutrophils Relative %: 60.8 % (ref 43.0–77.0)
Platelets: 360 10*3/uL (ref 150.0–400.0)
RBC: 4.46 Mil/uL (ref 3.87–5.11)
RDW: 14.5 % (ref 11.5–15.5)
WBC: 3.6 10*3/uL — ABNORMAL LOW (ref 4.0–10.5)

## 2023-10-30 LAB — COMPREHENSIVE METABOLIC PANEL
ALT: 11 U/L (ref 0–35)
AST: 13 U/L (ref 0–37)
Albumin: 4.3 g/dL (ref 3.5–5.2)
Alkaline Phosphatase: 161 U/L — ABNORMAL HIGH (ref 39–117)
BUN: 13 mg/dL (ref 6–23)
CO2: 20 meq/L (ref 19–32)
Calcium: 10.4 mg/dL (ref 8.4–10.5)
Chloride: 112 meq/L (ref 96–112)
Creatinine, Ser: 0.9 mg/dL (ref 0.40–1.20)
GFR: 68.79 mL/min (ref >60.00)
Glucose, Bld: 78 mg/dL (ref 70–99)
Potassium: 4 meq/L (ref 3.5–5.1)
Sodium: 140 meq/L (ref 135–145)
Total Bilirubin: 0.3 mg/dL (ref 0.2–1.2)
Total Protein: 7.5 g/dL (ref 6.0–8.3)

## 2023-10-30 LAB — IBC + FERRITIN
Ferritin: 60.9 ng/mL (ref 10.0–291.0)
Iron: 89 ug/dL (ref 42–145)
Saturation Ratios: 29.3 % (ref 20.0–50.0)
TIBC: 303.8 ug/dL (ref 250.0–450.0)
Transferrin: 217 mg/dL (ref 212.0–360.0)

## 2023-10-30 LAB — HEMOGLOBIN A1C: Hgb A1c MFr Bld: 5.6 % (ref 4.6–6.5)

## 2023-10-30 LAB — VITAMIN D 25 HYDROXY (VIT D DEFICIENCY, FRACTURES): VITD: <7 ng/mL — ABNORMAL LOW (ref 30.00–100.00)

## 2023-10-30 MED ORDER — VITAMIN D3 50 MCG (2000 UT) PO CAPS: 2000.0000 [IU] | ORAL_CAPSULE | Freq: Every day | ORAL | Status: AC

## 2023-10-30 NOTE — Assessment & Plan Note (Signed)
 Here for CPX Thyroid  nodules: Per Endo Osteoporosis: T-score -3.3 per DEXA 11/14/2022 ordered by gynecology, they have not recommended medication but will discuss the issue with gynecology in few weeks.  Recommend to start vitamin D . DJD: Hip pain for over 2 years, patient is ready for surgery, denies chest pain, difficulty breathing. EKG today: NSR Surgery requested A1c, will do.  Otherwise she is clear for surgery. Weight management: On Topamax  and phentermine  prescribed elsewhere. Anemia, the patient reports a history of anemia, check a CBC and anemia panel RTC 1 year

## 2023-10-30 NOTE — Progress Notes (Signed)
 Subjective:    Patient ID: Kathleen Burton, female    DOB: 1961-01-03, 63 y.o.   MRN: [id number]  DOS:  10/30/2023 Type of visit - description: CPX  Here for CPX  Has lifestyle limiting right hip pain.  Ready for surgery. Denies chest pain or difficulty breathing.  No palpitations.  No lower extremity edema.  Reports that from time to time sees drops of blood on the toilet paper after BM usually when she strains. Otherwise no GI or GU symptoms  Review of Systems  Other than above, a 14 point review of systems is negative     Past Medical History:  Diagnosis Date   Anemia    Dysmenorrhea    Glaucoma    Herpes    History of kidney stones    Thyroid  nodule    bx 04-2013    Past Surgical History:  Procedure Laterality Date   BREAST EXCISIONAL BIOPSY Left 2004   BREAST SURGERY     biospy   EXTRACORPOREAL SHOCK WAVE LITHOTRIPSY Left 11/03/2016   Procedure: LEFT EXTRACORPOREAL SHOCK WAVE LITHOTRIPSY (ESWL);  Surgeon: Donnice Brooks, MD;  Location: [facility];  Service: Urology;  Laterality: Left;   FOOT SURGERY Bilateral    NOVASURE ABLATION     Social History   Socioeconomic History   Marital status: Married    Spouse name: Not on file   Number of children: 1   Years of education: Not on file   Highest education level: Not on file  Occupational History   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    Employer: [employer]  Tobacco Use   Smoking status: Never   Smokeless tobacco: Never  Substance and Sexual Activity   Alcohol use: No   Drug use: No   Sexual activity: Yes  Other Topics Concern   Not on file  Social History Narrative   ** Merged History Encounter **       Lives w/ husband, child is at college   Social Drivers of Corporate Investment Banker Strain: Not on file  Food Insecurity: Not on file  Transportation Needs: Not on file  Physical Activity: Not on file  Stress: Not on file  Social Connections: Not on  file  Intimate Partner Violence: Not on file     Current Outpatient Medications  Medication Instructions   ibuprofen (ADVIL) 800 mg, Every 8 hours PRN   latanoprost (XALATAN) 0.005 % ophthalmic solution 1 drop, Daily at bedtime   phentermine  15 mg, Oral, BH-each morning   Polyethyl Glyc-Propyl Glyc PF (SYSTANE PRESERVATIVE FREE) 0.4-0.3 % SOLN Apply to eye.   timolol (TIMOPTIC) 0.5 % ophthalmic solution 1 drop, Every morning   topiramate  (TOPAMAX ) 50 mg, Oral, 3 times daily   Vitamin D3 2,000 Units, Oral, Daily       Objective:   Physical Exam BP 126/76   Pulse (!) 57   Temp 98 F (36.7 C) (Oral)   Resp 16   Ht 5' 5.5 (1.664 m)   Wt 158 lb 4 oz (71.8 kg)   SpO2 97%   BMI 25.93 kg/m  General: Well developed, NAD, BMI noted HEENT:  Normocephalic . Face symmetric, atraumatic Lungs:  CTA B Normal respiratory effort, no intercostal retractions, no accessory muscle use. Heart: RRR,  no murmur.  Abdomen:  Not distended, soft, non-tender. No rebound or rigidity.   Lower extremities: no pretibial edema bilaterally  Skin: Exposed areas without rash. Not pale. Not jaundice  Neurologic:  alert & oriented X3.  Speech normal, gait appropriate for age and unassisted Strength symmetric and appropriate for age.  Psych: Cognition and judgment appear intact.  Cooperative with normal attention span and concentration.  Behavior appropriate. No anxious or depressed appearing.     Assessment    Assessment Thyroid  nodule, BX 2014 benign, sees Dr Von Dysmenorrhea, NovaSure ablation Osteoporosis: T-score -3.3 (10-2022) Herpes Glaucoma   PLAN Here for CPX Tdap today  Had a shingrex @ [facility], no records  Had a recent flu shot, covid vax per patient.  Colonoscopy July 03, 2020, normal, per GI letter, Dr. Kristie   Labs: CMP CBC vitamin D  anemia panel  Female care: - Pap smear 11/09/2022 ([facility]) -[facility]: 09-2023 [facility] Labs: CMP CBC vitamin D  Diet exercise discussed Thyroid  nodules:  Per Endo Osteoporosis: T-score -3.3 per DEXA 11/14/2022 ordered by gynecology, they have not recommended medication but will discuss the issue with gynecology in few weeks.  Recommend to start vitamin D . DJD: Hip pain for over 2 years, patient is ready for surgery, denies chest pain, difficulty breathing. EKG today: NSR Surgery requested A1c, will do.  Otherwise she is clear for surgery. Weight management: On Topamax  and phentermine  prescribed elsewhere. Anemia, the patient reports a history of anemia, check a CBC and anemia panel RTC 1 year

## 2023-10-30 NOTE — Patient Instructions (Signed)
 Start taking vitamin D  2000 units daily   Please read the information about a healthcare power of attorney below  GO TO THE LAB : Get the blood work     Next visit with me in 1 year for a physical exam Please schedule it at the front desk       Health Care Power of attorney ,  Living will (Advance care planning documents)  If you already have a living will or healthcare power of attorney, is recommended you bring the copy to be scanned in your chart.   The document will be available to all the doctors you see in the system.  Advance care planning is a process that supports adults in  understanding and sharing their preferences regarding future medical care.  The patient's preferences are recorded in documents called Advance Directives and the can be modified at any time while the patient is in full mental capacity.   If you don't have one, please consider create one.      More information at: [email or web address]

## 2023-10-30 NOTE — Assessment & Plan Note (Signed)
 Here for CPX Tdap today  Had a [facility] @ [facility], no records  Had a recent flu shot, covid vax per patient.  Colonoscopy July 03, 2020, normal, per GI letter, Dr. Kristie   Labs: CMP CBC vitamin D  anemia panel  Female care: - Pap smear 11/09/2022 ([facility]) -[facility]: 09-2023 [facility] Labs: CMP CBC vitamin D  Diet exercise discussed

## 2023-10-31 MED ORDER — VITAMIN D (ERGOCALCIFEROL) 1.25 MG (50000 UNIT) PO CAPS
50000.0000 [IU] | ORAL_CAPSULE | ORAL | 0 refills | Status: AC
Start: 2023-10-31 — End: 2024-01-23

## 2023-10-31 NOTE — Telephone Encounter (Signed)
 Completed form faxed back to [facility] ATTN: Kerri Maze at [phone]. Form sent for scanning.

## 2023-10-31 NOTE — Addendum Note (Signed)
 Addended byConrad Olivette D on: 10/31/2023 10:50 AM   Modules accepted: Orders

## 2023-10-31 NOTE — Addendum Note (Signed)
 Addended byConrad [place] D on: 10/31/2023 10:51 AM   Modules accepted: Orders

## 2023-11-08 ENCOUNTER — Other Ambulatory Visit: Payer: Self-pay

## 2023-11-08 DIAGNOSIS — E041 Nontoxic single thyroid nodule: Secondary | ICD-10-CM

## 2023-11-08 DIAGNOSIS — Z8639 Personal history of other endocrine, nutritional and metabolic disease: Secondary | ICD-10-CM

## 2023-11-08 DIAGNOSIS — R7989 Other specified abnormal findings of blood chemistry: Secondary | ICD-10-CM

## 2023-11-08 MED ORDER — PHENTERMINE HCL 15 MG PO CAPS: 15.0000 mg | ORAL_CAPSULE | ORAL | 5 refills | Status: DC

## 2023-11-08 MED ORDER — TOPIRAMATE 50 MG PO TABS: 50.0000 mg | ORAL_TABLET | Freq: Three times a day (TID) | ORAL | 1 refills | Status: DC

## 2023-11-21 ENCOUNTER — Telehealth: Payer: Self-pay

## 2023-11-21 DIAGNOSIS — E041 Nontoxic single thyroid nodule: Secondary | ICD-10-CM

## 2023-11-21 DIAGNOSIS — E7800 Pure hypercholesterolemia, unspecified: Secondary | ICD-10-CM

## 2023-11-21 NOTE — Telephone Encounter (Signed)
Orders Placed This Encounter  Procedures   Lipid panel   TSH   T4, free   T3, free

## 2023-11-22 ENCOUNTER — Other Ambulatory Visit: Payer: 59

## 2023-11-23 ENCOUNTER — Encounter: Payer: Self-pay | Admitting: Endocrinology

## 2023-11-23 LAB — T3, FREE: T3, Free: 3.5 pg/mL (ref 2.3–4.2)

## 2023-11-23 LAB — LIPID PANEL
Cholesterol: 197 mg/dL
HDL: 78 mg/dL
LDL Cholesterol (Calc): 102 mg/dL — ABNORMAL HIGH
Non-HDL Cholesterol (Calc): 119 mg/dL
Total CHOL/HDL Ratio: 2.5 (calc)
Triglycerides: 81 mg/dL

## 2023-11-23 LAB — TSH: TSH: 0.34 m[IU]/L — ABNORMAL LOW (ref 0.40–4.50)

## 2023-11-23 LAB — T4, FREE: Free T4: 1.3 ng/dL (ref 0.8–1.8)

## 2023-11-28 ENCOUNTER — Ambulatory Visit: Payer: 59 | Admitting: Endocrinology

## 2023-11-28 ENCOUNTER — Encounter: Payer: Self-pay | Admitting: Endocrinology

## 2023-11-28 VITALS — BP 110/72 | HR 64 | Ht 65.5 in | Wt 159.8 lb

## 2023-11-28 DIAGNOSIS — E041 Nontoxic single thyroid nodule: Secondary | ICD-10-CM

## 2023-11-28 DIAGNOSIS — R7989 Other specified abnormal findings of blood chemistry: Secondary | ICD-10-CM | POA: Diagnosis not present

## 2023-11-28 DIAGNOSIS — E66811 Obesity, class 1: Secondary | ICD-10-CM | POA: Diagnosis not present

## 2023-11-28 DIAGNOSIS — E7800 Pure hypercholesterolemia, unspecified: Secondary | ICD-10-CM

## 2023-11-28 NOTE — Patient Instructions (Signed)
Labs 2-3 days prior to follow up visit with me.

## 2023-11-28 NOTE — Progress Notes (Signed)
Outpatient Endocrinology Note Kathleen Jossiah Smoak, MD   Patient's Name: Kathleen Burton    DOB: 03-29-1961    MRN: [id number]  REASON OF VISIT: Follow-up for thyroid nodule and weight management.  PCP: Wanda Plump, MD  HISTORY OF PRESENT ILLNESS:   Kathleen Burton is a 63 y.o. old female with past medical history as listed below is presented for a follow up for weight management and thyroid nodule.  Pertinent  History:  Patient was previously seen by Dr. Lucianne Muss and was last time seen in August 2024.  # Weight management -Patient was following with Dr. Lucianne Muss in this clinic, endocrinologist for a long time for weight management.  She was initially tried with Qsymia however due to lack of insurance coverage she was treated with equivalent doses in generic form.  She has been on long-term phentermine and Topamax, were started in 2014.  She has been on phentermine 15 mg daily and Topamax 50 mg 2 tablets in the morning and 1 tablet in the evening.  Based on office note from prior endocrinologist Dr. Lucianne Muss she preferred to take 3 tablets of Topamax instead of 2 tablets daily.  With the phentermine and Topamax patient has improved satiety, had significant weight loss when regimen was started.  She has been able to maintain weight loss when taking this combination of medications.  Patient states whenever she stopped taking this medication, she gains weight back rapidly.  No side effect with this regimen such as increased heart rate, nausea or drowsiness.Baseline BMI was 33 and improved to BMI of 26 with the medication.  She has been trying to follow a low-fat diet and avoid excessive portion of carbohydrates. Because of the hip pain she has not been able to do exercise or walking.  She is trying to do some chair exercises.  Weight has been fluctuating based on her compliance with diet.  # Thyroid nodule: Patient has isthmus nodule measuring 2.4 x 1.6 x 2.0 cm partially cystic with ultrasound  in April 05, 2013, she underwent FNA of this nodule in July 2014 with benign cytology.  This thyroid nodule has been monitored clinically, she did not have repeat ultrasound thyroid since 2014.  She has occasional low TSH with normal free T4 and free T3 and most of the time normal thyroid function test in the past. She has not been on thyroid medication in the past.  She has not had a nuclear thyroid scan.  Interval history  Patient presented for the follow-up of weight management and thyroid nodule.  Patient has been taking phentermine 15 mg daily in the morning and Topamax 50 mg 2 tablets in the morning and 1 tablet in the evening.  She denies palpitation, heat intolerance.  Body weight has remained relatively stable.  She has not noticed any increase in size of thyroid nodule.  No neck discomfort or any neck compressive symptoms.  Blood pressure and heart rate normal today.  Recent laboratory results reviewed.  She has mild to TSH with normal free T4 and free T3.  She has not been on supplement biotin.  She has not been on any glucocorticoid recently.   Latest Reference Range & Units 11/22/23 11:57  TSH 0.40 - 4.50 mIU/L 0.34 (L)  Triiodothyronine,Free,Serum 2.3 - 4.2 pg/mL 3.5  T4,Free(Direct) 0.8 - 1.8 ng/dL 1.3  (L): Data is abnormally low  Lipid panel with mildly elevated LDL as noted below.   Latest Reference Range & Units 11/22/23 11:57  HDL Cholesterol >  OR = 50 mg/dL 78  LDL Cholesterol (Calc) mg/dL (calc) 161 (H)  Non-HDL Cholesterol (Calc) <130 mg/dL (calc) 096  Triglycerides <150 mg/dL 81  (H): Data is abnormally high  With the recent primary care evaluation patient has severe vitamin D deficiency with vitamin D level < 7, appropriately started on high-dose of vitamin D2 supplement 50,000 international unit weekly and D3 2000 units daily.  With a review of chart noted to have DEXA scan in January 2024 by GYN, consistent with osteoporosis with lowest T-score of -3.3 at right  hip, has not been on antiresorptive therapy.  Patient reports she has follow-up with GYN next week and is planning to discuss about osteoporosis at that visit.  Patient has no fall and fracture.  Patient has a right hip pain, osteoarthritis and used to be on steroid injection and NSAIDs medication for pain control, reports has plan for right hip surgery next month.  REVIEW OF SYSTEMS:  As per history of present illness.   PAST MEDICAL HISTORY: Past Medical History:  Diagnosis Date   Anemia    Dysmenorrhea    Glaucoma    Herpes    History of kidney stones    Thyroid nodule    bx 04-2013    PAST SURGICAL HISTORY: Past Surgical History:  Procedure Laterality Date   BREAST EXCISIONAL BIOPSY Left 2004   BREAST SURGERY     biospy   EXTRACORPOREAL SHOCK WAVE LITHOTRIPSY Left 11/03/2016   Procedure: LEFT EXTRACORPOREAL SHOCK WAVE LITHOTRIPSY (ESWL);  Surgeon: Jerilee Field, MD;  Location: [facility];  Service: Urology;  Laterality: Left;   FOOT SURGERY Bilateral    NOVASURE ABLATION      ALLERGIES: Allergies  Allergen Reactions   Latex Swelling    FAMILY HISTORY:  Family History  Problem Relation Age of Onset   Hypertension Mother        M- brother    Leukemia Mother        deceased   Diabetes Brother    Breast cancer Maternal Aunt        aunt-cousin   Hypothyroidism Sister        had thyroid surgery   Breast cancer Cousin    HIV Son    Colon cancer Neg Hx    CAD Neg Hx     SOCIAL HISTORY: Social History   Socioeconomic History   Marital status: Married    Spouse name: Not on file   Number of children: 1   Years of education: Not on file   Highest education level: Not on file  Occupational History   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    Employer: [employer]  Tobacco Use   Smoking status: Never   Smokeless tobacco: Never  Substance and Sexual Activity   Alcohol use: No   Drug use: No   Sexual activity: Yes  Other  Topics Concern   Not on file  Social History Narrative   ** Merged History Encounter **       Lives w/ husband, child is at college   Social Drivers of Corporate investment banker Strain: Not on file  Food Insecurity: Not on file  Transportation Needs: Not on file  Physical Activity: Not on file  Stress: Not on file  Social Connections: Not on file    MEDICATIONS:  Current Outpatient Medications  Medication Sig Dispense Refill   Cholecalciferol (VITAMIN D3) 50 MCG (2000 UT) capsule Take 1 capsule (2,000 Units  total) by mouth daily.     ibuprofen (ADVIL) 800 MG tablet Take 800 mg by mouth every 8 (eight) hours as needed.     latanoprost (XALATAN) 0.005 % ophthalmic solution 1 drop at bedtime.     phentermine 15 MG capsule Take 1 capsule (15 mg total) by mouth every morning. 30 capsule 5   Polyethyl Glyc-Propyl Glyc PF (SYSTANE PRESERVATIVE FREE) 0.4-0.3 % SOLN Apply to eye.     timolol (TIMOPTIC) 0.5 % ophthalmic solution 1 drop every morning.     topiramate (TOPAMAX) 50 MG tablet Take 1 tablet (50 mg total) by mouth 3 (three) times daily. 270 tablet 1   Vitamin D, Ergocalciferol, (DRISDOL) 1.25 MG (50000 UNIT) CAPS capsule Take 1 capsule (50,000 Units total) by mouth every 7 (seven) days. 12 capsule 0   No current facility-administered medications for this visit.    PHYSICAL EXAM: Vitals:   11/28/23 1314  BP: 110/72  Pulse: 64  Weight: 159 lb 12.8 oz (72.5 kg)  Height: 5' 5.5" (1.664 m)   Body mass index is 26.19 kg/m.  Wt Readings from Last 3 Encounters:  11/28/23 159 lb 12.8 oz (72.5 kg)  10/30/23 158 lb 4 oz (71.8 kg)  06/02/23 157 lb 9.6 oz (71.5 kg)     General: Well developed, well nourished female in no apparent distress.  HEENT: AT/[place], no external lesions. Hearing intact to the spoken word Eyes: EOMI. No stare, proptosis. Conjunctiva clear and no icterus. Neck: Trachea midline, neck supple without appreciable thyromegaly or lymphadenopathy and + palpable  isthmus thyroid nodule, mobile and soft. Lungs: Clear to auscultation, no wheeze. Respirations not labored Heart: S1S2, Regular in rate and rhythm. No loud murmurs Abdomen: Soft, non tender, non distended Neurologic: Alert, oriented, normal speech, deep tendon biceps reflexes normal,  no gross focal neurological deficit Extremities: No pedal pitting edema, no tremors of outstretched hands Skin: Warm, color good.  Psychiatric: Does not appear depressed or anxious  PERTINENT HISTORIC LABORATORY AND IMAGING STUDIES:  All pertinent laboratory results were reviewed. Please see HPI also for further details.   TSH  Date Value Ref Range Status  11/22/2023 0.34 (L) 0.40 - 4.50 mIU/L Final  05/30/2023 0.62 0.35 - 5.50 uIU/mL Final  01/17/2023 0.36 0.35 - 5.50 uIU/mL Final     ASSESSMENT / PLAN  1. Obesity (BMI 30.0-34.9)   2. Low TSH level   3. Thyroid nodule   4. Hypercholesterolemia    # Obesity baseline BMI 33 -Patient has been on long-term combination medication phentermine 15 mg daily and Topamax 150 mg total per day.   -She has been able to lose and maintain body weight, current BMI 26.  Brand-name Qsymia was not covered and not able to afford. -Currently she is not able to exercise due to hip pain, planning for hip surgery next month.  Discussed about maintenance of diet with portion control and limiting calories and carbohydrates. -Her current BMI 26, no other cardiovascular risk factors. -Discussed that FDA approved phentermine for short-term use.  Phentermine is a stimulant and it is designed for short-term use.  However some studies suggest it can be safe for long-term use as well.  Discussed that regarding phentermine, concern has been raised about addiction and generally recommended for short-term use.  Patient wants to continue phentermine and Topamax for long-term use, she understands the risks.  She mentioned that she cannot be off of this medications otherwise she would gain  weight rapidly and detrimental for her health.  Plan: -Will maintain on the same regimen of phentermine and topiramate. -Patient will talk with her medical insurance if there is any other weight loss medications would be covered including GLP-1 receptor agonist, and will let us know.  # Thyroid nodule /normal thyroid function test -Patient has isthmus thyroid nodule measuring 2.4 cm, ultrasound in June 2014, status post FNA with benign cytology.  Thyroid nodule has been monitored clinically.  No follow-up ultrasound has been done since 2014. -Patient has intermittently low TSH, with normal free T4 and free T3.  Patient has no hyperthyroid symptoms.  She has never been on thyroid medication.  Recent thyroid function test with mildly low TSH and normal free T4 and free T3.  Plan: -Will continue to monitor thyroid function test.  No plan for thyroid medication at this time.  She has no hypo or hyperthyroid symptoms. -Will plan to repeat thyroid function test including thyrotropin receptor antibody in next set of labs prior to follow-up visit. -Discussed that we need to monitor thyroid nodule with repeat ultrasound, this is not urgent and it can be planned at the time of next follow-up visit to be done after that.  # Hypercholesterolemia : -Recent lab with mildly elevated LDL , other lipids level were in the acceptable range.  Not a candidate for pharmacological treatment at this time.  Will continue to monitor.  # Severe vitamin D deficiency : -Currently on vitamin D 2 50,000 international unit weekly and vitamin D3 daily, and is by primary care provider.  # Osteoporosis : -She had DEXA scan in January 2014 by GYN.  She needs to antiresorptive therapy.  Patient will discuss with OB/GYN has follow-up appointment next week.  I would recommend to treat with antiresorptive therapy after normalizing of vitamin D level.  It is not recommended to treat with antiresorptive therapy when vitamin D is  severely low.  Patient will talk with GYN regarding osteoporosis management.  Endocrinology is available if needed.   Kathleen Burton was seen today for follow-up.  Diagnoses and all orders for this visit:  Obesity (BMI 30.0-34.9)  Low TSH level -     T4, free -     TSH -     T3, free -     TRAb (TSH Receptor Binding Antibody)  Thyroid nodule -     T4, free -     TSH -     T3, free  Hypercholesterolemia   I spent 45 minutes in this visit, with face to face encounter, medical records review, imagings review, documentation and discussion with patient about of findings, diagnosis, medications, management plan as above. All questions were answered.   DISPOSITION Follow up in clinic in 4 months suggested.  All questions answered and patient verbalized understanding of the plan.  Kathleen Adylynn Hertenstein, MD [facility] Endocrinology [facility] [address] [place], [address] [place], [place] [address] Phone # [phone]  At least part of this note was generated using voice recognition software. Inadvertent word errors may have occurred, which were not recognized during the proofreading process.

## 2023-11-29 LAB — HM PAP SMEAR: HPV, high-risk: NEGATIVE

## 2023-11-29 LAB — RESULTS CONSOLE HPV: CHL HPV: NEGATIVE

## 2023-12-12 ENCOUNTER — Other Ambulatory Visit: Payer: Self-pay | Admitting: Endocrinology

## 2023-12-12 DIAGNOSIS — R7989 Other specified abnormal findings of blood chemistry: Secondary | ICD-10-CM

## 2023-12-12 DIAGNOSIS — Z8639 Personal history of other endocrine, nutritional and metabolic disease: Secondary | ICD-10-CM

## 2023-12-12 DIAGNOSIS — E041 Nontoxic single thyroid nodule: Secondary | ICD-10-CM

## 2023-12-12 NOTE — Telephone Encounter (Signed)
 Medication refill request complete

## 2023-12-14 ENCOUNTER — Telehealth: Payer: Self-pay

## 2023-12-14 ENCOUNTER — Other Ambulatory Visit: Payer: Self-pay

## 2023-12-14 ENCOUNTER — Other Ambulatory Visit: Payer: Self-pay | Admitting: Endocrinology

## 2023-12-14 DIAGNOSIS — Z8639 Personal history of other endocrine, nutritional and metabolic disease: Secondary | ICD-10-CM

## 2023-12-14 DIAGNOSIS — E041 Nontoxic single thyroid nodule: Secondary | ICD-10-CM

## 2023-12-14 DIAGNOSIS — R7989 Other specified abnormal findings of blood chemistry: Secondary | ICD-10-CM

## 2023-12-14 NOTE — Telephone Encounter (Signed)
 Phentermine refill request complete

## 2023-12-16 HISTORY — PX: TOTAL HIP ARTHROPLASTY: SHX124

## 2023-12-28 ENCOUNTER — Other Ambulatory Visit: Payer: 59

## 2024-01-04 ENCOUNTER — Ambulatory Visit: Payer: 59 | Admitting: Endocrinology

## 2024-01-24 ENCOUNTER — Encounter: Payer: Self-pay | Admitting: Internal Medicine

## 2024-01-26 ENCOUNTER — Encounter: Payer: Self-pay | Admitting: Internal Medicine

## 2024-01-30 ENCOUNTER — Other Ambulatory Visit (INDEPENDENT_AMBULATORY_CARE_PROVIDER_SITE_OTHER): Payer: 59

## 2024-01-30 DIAGNOSIS — E559 Vitamin D deficiency, unspecified: Secondary | ICD-10-CM

## 2024-01-30 DIAGNOSIS — R7989 Other specified abnormal findings of blood chemistry: Secondary | ICD-10-CM

## 2024-01-30 LAB — GAMMA GT: GGT: 37 U/L (ref 7–51)

## 2024-01-30 LAB — HEPATIC FUNCTION PANEL
ALT: 12 U/L (ref 0–35)
AST: 13 U/L (ref 0–37)
Albumin: 4.4 g/dL (ref 3.5–5.2)
Alkaline Phosphatase: 154 U/L — ABNORMAL HIGH (ref 39–117)
Bilirubin, Direct: 0 mg/dL (ref 0.0–0.3)
Total Bilirubin: 0.3 mg/dL (ref 0.2–1.2)
Total Protein: 7.6 g/dL (ref 6.0–8.3)

## 2024-01-30 LAB — VITAMIN D 25 HYDROXY (VIT D DEFICIENCY, FRACTURES): VITD: 43.28 ng/mL (ref 30.00–100.00)

## 2024-01-30 NOTE — Addendum Note (Signed)
 Addended by: Susa Engman A on: 01/30/2024 11:43 AM   Modules accepted: Orders

## 2024-02-02 LAB — ALKALINE PHOSPHATASE, ISOENZYMES
Alkaline Phosphatase: 179 IU/L — ABNORMAL HIGH (ref 44–121)
BONE FRACTION: 69 % — ABNORMAL HIGH (ref 14–68)
INTESTINAL FRAC.: 2 % (ref 0–18)
LIVER FRACTION: 29 % (ref 18–85)

## 2024-02-05 ENCOUNTER — Telehealth: Payer: Self-pay

## 2024-02-05 NOTE — Telephone Encounter (Signed)
 Copied from CRM [id number]. Topic: General - Other >> Feb 05, 2024 11:59 AM Howard Macho wrote: Reason for CRM: patient would like to be called concerning her recent lab results and the understanding of them

## 2024-02-05 NOTE — Telephone Encounter (Signed)
 Pt calling regarding labs from 01/30/24, please advise?

## 2024-02-06 NOTE — Telephone Encounter (Signed)
 AP elevated at 179, GGT normal, vitamin D  normal. Spoke with the patient, she just had hip surgery few weeks ago. We agreed on: Return to the office around September 2025, at that time we will recheck alkaline phosphatase, vitamin D  and ordered a bone scan if AP continue to be elevated.

## 2024-03-25 ENCOUNTER — Other Ambulatory Visit: Payer: Self-pay | Admitting: Endocrinology

## 2024-03-25 ENCOUNTER — Other Ambulatory Visit: Payer: 59

## 2024-03-26 ENCOUNTER — Ambulatory Visit: Payer: Self-pay | Admitting: Endocrinology

## 2024-03-28 ENCOUNTER — Ambulatory Visit: Payer: 59 | Admitting: Endocrinology

## 2024-03-28 ENCOUNTER — Encounter: Payer: Self-pay | Admitting: Endocrinology

## 2024-03-28 VITALS — BP 116/70 | HR 75 | Resp 20 | Ht 65.5 in | Wt 158.4 lb

## 2024-03-28 DIAGNOSIS — E7800 Pure hypercholesterolemia, unspecified: Secondary | ICD-10-CM | POA: Diagnosis not present

## 2024-03-28 DIAGNOSIS — E66811 Obesity, class 1: Secondary | ICD-10-CM

## 2024-03-28 DIAGNOSIS — E041 Nontoxic single thyroid nodule: Secondary | ICD-10-CM | POA: Diagnosis not present

## 2024-03-28 LAB — T3, FREE: T3, Free: 3.4 pg/mL (ref 2.3–4.2)

## 2024-03-28 LAB — T4, FREE: Free T4: 1.3 ng/dL (ref 0.8–1.8)

## 2024-03-28 LAB — TRAB (TSH RECEPTOR BINDING ANTIBODY): TRAB: <1 IU/L (ref <=2.00)

## 2024-03-28 LAB — TSH: TSH: 0.52 m[IU]/L (ref 0.40–4.50)

## 2024-03-28 MED ORDER — PHENTERMINE HCL 30 MG PO CAPS: 30.0000 mg | ORAL_CAPSULE | ORAL | 3 refills | Status: DC

## 2024-03-28 NOTE — Progress Notes (Signed)
 Outpatient Endocrinology Note [place] Laderius Valbuena, MD   Patient's Name: Kathleen Burton    DOB: Jun 10, 1961    MRN: [id number]  REASON OF VISIT: Follow-up for thyroid  nodule and weight management.  PCP: Ezell Hollow, MD  HISTORY OF PRESENT ILLNESS:   Kathleen Burton is a 63 y.o. old female with past medical history as listed below is presented for a follow up for weight management and thyroid  nodule.  Pertinent  History:  Patient was previously seen by Dr. Hubert Madden and was last time seen in August 2024.  # Weight management -Patient was following with Dr. Hubert Madden in this clinic, endocrinologist for a long time for weight management.  She was initially tried with Qsymia  however due to lack of insurance coverage she was treated with equivalent doses in generic form.  She has been on long-term phentermine  and Topamax , were started in 2014.  She has been on phentermine  15 mg daily and Topamax  50 mg 2 tablets in the morning and 1 tablet in the evening.  Based on office note from prior endocrinologist Dr. Hubert Madden she preferred to take 3 tablets of Topamax  instead of 2 tablets daily.  With the phentermine  and Topamax  patient has improved satiety, had significant weight loss when regimen was started.  She has been able to maintain weight loss when taking this combination of medications.  Patient states whenever she stopped taking this medication, she gains weight back rapidly.  No side effect with this regimen such as increased heart rate, nausea or drowsiness.Baseline BMI was 33 and improved to BMI of 26 with the medication.  She has been trying to follow a low-fat diet and avoid excessive portion of carbohydrates. Because of the hip pain she has not been able to do exercise or walking.  She is trying to do some chair exercises.  Weight has been fluctuating based on her compliance with diet.  # Thyroid  nodule: Patient has isthmus nodule measuring 2.4 x 1.6 x 2.0 cm partially cystic with ultrasound  in April 05, 2013, she underwent FNA of this nodule in July 2014 with benign cytology.  This thyroid  nodule has been monitored clinically, she did not have repeat ultrasound thyroid  since 2014.  She has occasional low TSH with normal free T4 and free T3 and most of the time normal thyroid  function test in the past. She has not been on thyroid  medication in the past.  She has not had a nuclear thyroid  scan.  # She has vitamin D  deficiency, follow-up with primary care provider on vitamin D  supplement.  With a review of chart noted to have DEXA scan in January 2024 by GYN, consistent with osteoporosis with lowest T-score of -3.3 at right hip, has not been on antiresorptive therapy.  Follow-up with GYN for osteoporosis management.  Interval history  Patient reports she had right hip replacement recently and has limited activity mostly sedentary.  Body weight has been relatively stable.  She like to lose more.  She has been currently on phentermine  and topiramate .  Denies palpitation or elevated blood pressure.  No neck discomfort.  Recent lab results with normalizing of thyroid  function test including negative thyrotropin receptor antibody.  No other complaints today.   Latest Reference Range & Units 03/25/24 11:20  TSH 0.40 - 4.50 mIU/L 0.52  Triiodothyronine,Free,Serum 2.3 - 4.2 pg/mL 3.4  T4,Free(Direct) 0.8 - 1.8 ng/dL 1.3  TRAB <=4.09 IU/L <8.11     REVIEW OF SYSTEMS:  As per history of present illness.  PAST MEDICAL HISTORY: Past Medical History:  Diagnosis Date   Anemia    Dysmenorrhea    Glaucoma    Herpes    History of kidney stones    Thyroid  nodule    bx 04-2013    PAST SURGICAL HISTORY: Past Surgical History:  Procedure Laterality Date   BREAST EXCISIONAL BIOPSY Left 2004   BREAST SURGERY     biospy   EXTRACORPOREAL SHOCK WAVE LITHOTRIPSY Left 11/03/2016   Procedure: LEFT EXTRACORPOREAL SHOCK WAVE LITHOTRIPSY (ESWL);  Surgeon: Christina Coyer, MD;  Location: [facility];   Service: Urology;  Laterality: Left;   FOOT SURGERY Bilateral    NOVASURE ABLATION      ALLERGIES: Allergies  Allergen Reactions   Latex Swelling    FAMILY HISTORY:  Family History  Problem Relation Age of Onset   Hypertension Mother        M- brother    Leukemia Mother        deceased   Diabetes Brother    Breast cancer Maternal Aunt        aunt-cousin   Hypothyroidism Sister        had thyroid  surgery   Breast cancer Cousin    HIV Son    Colon cancer Neg Hx    CAD Neg Hx     SOCIAL HISTORY: Social History   Socioeconomic History   Marital status: Married    Spouse name: Not on file   Number of children: 1   Years of education: Not on file   Highest education level: Not on file  Occupational History   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    Employer: [employer]  Tobacco Use   Smoking status: Never   Smokeless tobacco: Never  Substance and Sexual Activity   Alcohol use: No   Drug use: No   Sexual activity: Yes  Other Topics Concern   Not on file  Social History Narrative   ** Merged History Encounter **       Lives w/ husband, child is at college   Social Drivers of Corporate investment banker Strain: Not on file  Food Insecurity: Not on file  Transportation Needs: Not on file  Physical Activity: Not on file  Stress: Not on file  Social Connections: Not on file    MEDICATIONS:  Current Outpatient Medications  Medication Sig Dispense Refill   Cholecalciferol (VITAMIN D3) 50 MCG (2000 UT) capsule Take 1 capsule (2,000 Units total) by mouth daily.     latanoprost (XALATAN) 0.005 % ophthalmic solution 1 drop at bedtime.     phentermine  30 MG capsule Take 1 capsule (30 mg total) by mouth every morning. 30 capsule 3   Polyethyl Glyc-Propyl Glyc PF (SYSTANE PRESERVATIVE FREE) 0.4-0.3 % SOLN Apply to eye.     timolol (TIMOPTIC) 0.5 % ophthalmic solution 1 drop every morning.     topiramate  (TOPAMAX ) 50 MG tablet  TAKE 1 TABLET BY MOUTH 3 TIMES  DAILY 270 tablet 3   No current facility-administered medications for this visit.    PHYSICAL EXAM: Vitals:   03/28/24 1352  BP: 116/70  Pulse: 75  Resp: 20  SpO2: 98%  Weight: 158 lb 6.4 oz (71.8 kg)  Height: 5' 5.5 (1.664 m)    Body mass index is 25.96 kg/m.  Wt Readings from Last 3 Encounters:  03/28/24 158 lb 6.4 oz (71.8 kg)  11/28/23 159 lb 12.8 oz (72.5 kg)  10/30/23 158 lb 4  oz (71.8 kg)     General: Well developed, well nourished female in no apparent distress.  HEENT: AT/[place], no external lesions. Hearing intact to the spoken word Eyes: EOMI. No stare, proptosis. Conjunctiva clear and no icterus. Neck: Trachea midline, neck supple without appreciable thyromegaly or lymphadenopathy and + palpable isthmus thyroid  nodule, mobile and soft. Lungs: Clear to auscultation, no wheeze. Respirations not labored Heart: S1S2, Regular in rate and rhythm. No loud murmurs Abdomen: Soft, non tender, non distended Neurologic: Alert, oriented, normal speech, deep tendon biceps reflexes normal,  no gross focal neurological deficit Extremities: No pedal pitting edema, no tremors of outstretched hands Skin: Warm, color good.  Psychiatric: Does not appear depressed or anxious  PERTINENT HISTORIC LABORATORY AND IMAGING STUDIES:  All pertinent laboratory results were reviewed. Please see HPI also for further details.   TSH  Date Value Ref Range Status  03/25/2024 0.52 0.40 - 4.50 mIU/L Final  11/22/2023 0.34 (L) 0.40 - 4.50 mIU/L Final  05/30/2023 0.62 0.35 - 5.50 uIU/mL Final     ASSESSMENT / PLAN  1. Obesity (BMI 30.0-34.9)   2. Thyroid  nodule   3. Hypercholesterolemia     # Obesity baseline BMI 33 -Patient has been on long-term combination medication phentermine  15 mg daily and Topamax  150 mg total per day.   -She has been able to lose and maintain body weight, current BMI 26.  Brand-name Qsymia  was not covered and not able to afford. -Lately  she has not been able to do exercise due to recent right hip surgery. -Her current BMI 26, no other cardiovascular risk factors. -Discussed that FDA approved phentermine  for short-term use.  Phentermine  is a stimulant and it is approved for short-term use.  However some studies suggest it can be safe for long-term use as well.  Discussed that regarding phentermine , concern has been raised about addiction and generally recommended for short-term use.  Patient wants to continue phentermine  and Topamax  for long-term use, she understands the risks.  She mentioned that she cannot be off of this medications otherwise she would gain weight rapidly and detrimental for her health.  Plan: -Increase phentermine  from 15 to 30 mg daily.  Continue Topamax  150 mg total per day.  # Thyroid  nodule /normal thyroid  function test -Patient has isthmus thyroid  nodule measuring 2.4 cm, ultrasound in June 2014, status post FNA with benign cytology.  Thyroid  nodule has been monitored clinically.  No follow-up ultrasound has been done since 2014. -Patient has intermittently low TSH, with normal free T4 and free T3.  Patient has no hyperthyroid symptoms.  She has never been on thyroid  medication.  Thyrotropin receptor antibody negative.  Recent thyroid  function test normal..  Plan: -No indication and plan of medication for thyroid  at this time. -Consider repeating ultrasound thyroid  to monitor isthmus thyroid  nodule, will discuss with patient in follow-up visit.  # Hypercholesterolemia : -Mildly elevated LDL , other lipids level were in the acceptable range.  Not a candidate for pharmacological treatment at this time.  Will continue to monitor.  # Severe vitamin D  deficiency : On vitamin D  supplement, managed by primary care provider.  # Osteoporosis : -She had DEXA scan in January 2025 by GYN.  She needs to antiresorptive therapy.  She has been following with OB/GYN, she states she will discuss in coming follow-up visit  in few days.  Endocrinology is available if needed.   Diagnoses and all orders for this visit:  Obesity (BMI 30.0-34.9) -     phentermine   30 MG capsule; Take 1 capsule (30 mg total) by mouth every morning.  Thyroid  nodule  Hypercholesterolemia   DISPOSITION Follow up in clinic in 4 months suggested.  All questions answered and patient verbalized understanding of the plan.  [place] Johnanna Bakke, MD [facility] End[facility]in[facility] [address] [place], [address] [place], [place] [address] Phone # [phone]  At least part of this note was generated using voice recognition software. Inadvertent word errors may have occurred, which were not recognized during the proofreading process.

## 2024-03-29 ENCOUNTER — Encounter: Payer: Self-pay | Admitting: Endocrinology

## 2024-06-21 ENCOUNTER — Ambulatory Visit: Admitting: Internal Medicine

## 2024-06-24 ENCOUNTER — Telehealth: Payer: Self-pay

## 2024-06-24 NOTE — Telephone Encounter (Signed)
 See my chart message

## 2024-06-24 NOTE — Telephone Encounter (Signed)
 Copied from CRM [id number]. Topic: Clinical - Medication Question >> Jun 24, 2024 11:14 AM Dedra B wrote: Reason for CRM: Pt is requesting a covid vaccine prescription be sent to [facility] on [address] [place], [place] [address]. Patient has appt tomorrow at 10. Pls notify pt when it has been sent.

## 2024-06-27 MED ORDER — COVID-19 MRNA VAC-TRIS(PFIZER) 30 MCG/0.3ML IM SUSY: 0.3000 mL | PREFILLED_SYRINGE | Freq: Once | INTRAMUSCULAR | 0 refills | Status: AC

## 2024-06-27 NOTE — Addendum Note (Signed)
 Addended by: Marianne Golightly D on: 06/27/2024 04:26 PM   Modules accepted: Orders

## 2024-07-05 ENCOUNTER — Encounter: Payer: Self-pay | Admitting: Internal Medicine

## 2024-07-05 ENCOUNTER — Ambulatory Visit: Admitting: Internal Medicine

## 2024-07-05 VITALS — BP 118/68 | HR 66 | Temp 97.7°F | Resp 16 | Ht 65.5 in | Wt 144.4 lb

## 2024-07-05 DIAGNOSIS — R748 Abnormal levels of other serum enzymes: Secondary | ICD-10-CM | POA: Diagnosis not present

## 2024-07-05 DIAGNOSIS — M818 Other osteoporosis without current pathological fracture: Secondary | ICD-10-CM | POA: Diagnosis not present

## 2024-07-05 DIAGNOSIS — M810 Age-related osteoporosis without current pathological fracture: Secondary | ICD-10-CM | POA: Insufficient documentation

## 2024-07-05 DIAGNOSIS — E559 Vitamin D deficiency, unspecified: Secondary | ICD-10-CM | POA: Diagnosis not present

## 2024-07-05 LAB — COMPREHENSIVE METABOLIC PANEL WITH GFR
ALT: 9 U/L (ref 0–35)
AST: 12 U/L (ref 0–37)
Albumin: 4.3 g/dL (ref 3.5–5.2)
Alkaline Phosphatase: 191 U/L — ABNORMAL HIGH (ref 39–117)
BUN: 16 mg/dL (ref 6–23)
CO2: 21 meq/L (ref 19–32)
Calcium: 10.8 mg/dL — ABNORMAL HIGH (ref 8.4–10.5)
Chloride: 109 meq/L (ref 96–112)
Creatinine, Ser: 0.87 mg/dL (ref 0.40–1.20)
GFR: 71.3 mL/min (ref >60.00)
Glucose, Bld: 75 mg/dL (ref 70–99)
Potassium: 4.3 meq/L (ref 3.5–5.1)
Sodium: 138 meq/L (ref 135–145)
Total Bilirubin: 0.5 mg/dL (ref 0.2–1.2)
Total Protein: 7.7 g/dL (ref 6.0–8.3)

## 2024-07-05 LAB — VITAMIN D 25 HYDROXY (VIT D DEFICIENCY, FRACTURES): VITD: 45.77 ng/mL (ref 30.00–100.00)

## 2024-07-05 NOTE — Progress Notes (Signed)
   Subjective:    Patient ID: Kathleen Burton, female    DOB: 06-10-1961, 63 y.o.   MRN: [id number]  DOS:  07/05/2024 Type of visit - description: Follow-up  Here for assessment of increased alkaline phosphatase. Had hip surgery few months ago, is feeling great. Has no major aches or pains.  Review of Systems See above   Past Medical History:  Diagnosis Date   Anemia    Dysmenorrhea    Glaucoma    Herpes    History of kidney stones    Thyroid  nodule    bx 04-2013    Past Surgical History:  Procedure Laterality Date   BREAST EXCISIONAL BIOPSY Left 2004   BREAST SURGERY     biospy   EXTRACORPOREAL SHOCK WAVE LITHOTRIPSY Left 11/03/2016   Procedure: LEFT EXTRACORPOREAL SHOCK WAVE LITHOTRIPSY (ESWL);  Surgeon: Donnice Brooks, MD;  Location: [facility];  Service: Urology;  Laterality: Left;   FOOT SURGERY Bilateral    NOVASURE ABLATION      Current Outpatient Medications  Medication Instructions   latanoprost (XALATAN) 0.005 % ophthalmic solution 1 drop, Daily at bedtime   phentermine  30 mg, Oral, BH-each morning   Polyethyl Glyc-Propyl Glyc PF (SYSTANE PRESERVATIVE FREE) 0.4-0.3 % SOLN Apply to eye.   timolol (TIMOPTIC) 0.5 % ophthalmic solution 1 drop, Every morning   topiramate  (TOPAMAX ) 50 mg, Oral, 3 times daily   Vitamin D3 2,000 Units, Oral, Daily       Objective:   Physical Exam BP 118/68   Pulse 66   Temp 97.7 F (36.5 C) (Oral)   Resp 16   Ht 5' 5.5 (1.664 m)   Wt 144 lb 6 oz (65.5 kg)   BMI 23.66 kg/m  General:   Well developed, NAD, BMI noted. HEENT:  Normocephalic . Face symmetric, atraumatic Lungs:  CTA B Normal respiratory effort, no intercostal retractions, no accessory muscle use. Heart: RRR,  no murmur.  Lower extremities: no pretibial edema bilaterally  Skin: Not pale. Not jaundice Neurologic:  alert & oriented X3.  Speech normal, gait appropriate for age and unassisted Psych--  Cognition and judgment appear intact.  Cooperative  with normal attention span and concentration.  Behavior appropriate. No anxious or depressed appearing.      Assessment     Assessment Thyroid  nodule, BX 2014 benign, sees Dr Von Dysmenorrhea, NovaSure ablation Osteoporosis: T-score -3.3 (10-2022) Herpes Glaucoma Increased alkaline phosphatase  PLAN Vitamin D  deficiency: On supplements, checking labs Osteoporosis: With low T-score, patient tells me that endocrinology plans to address that issue with her at the next visit. Increased alkaline phosphatase. Has been elevated since 2021. h/o osteorosis, last T-score -3.3.  History of vitamin D  deficiency with level satisfactory April 2025. GGT April 2025 normal. Plan: CMP, alkaline phosphatase isoenzymes, for completeness repeat GGT.  Further advise w/ results Preventive care: Already had a flu shot and a COVID-vaccine RTC 10-2024 CPX

## 2024-07-05 NOTE — Patient Instructions (Signed)
   GO TO THE LAB :  Get the blood work   Your results will be posted on MyChart with my comments  Go to the front desk for the checkout Please make an appointment for a physical exam by January 2026

## 2024-07-05 NOTE — Assessment & Plan Note (Signed)
 Vitamin D  deficiency: On supplements, checking labs Osteoporosis: With low T-score, patient tells me that endocrinology plans to address that issue with her at the next visit. Increased alkaline phosphatase. Has been elevated since 2021. h/o osteorosis, last T-score -3.3.  History of vitamin D  deficiency with level satisfactory April 2025. GGT April 2025 normal. Plan: CMP, alkaline phosphatase isoenzymes, for completeness repeat GGT.  Further advise w/ results Preventive care: Already had a flu shot and a COVID-vaccine RTC 10-2024 CPX

## 2024-07-11 LAB — ALKALINE PHOSPHATASE, ISOENZYMES
Alkaline Phosphatase: 219 IU/L — ABNORMAL HIGH (ref 49–135)
BONE FRACTION: 85 % — AB (ref 14–68)
INTESTINAL FRAC.: 1 % (ref 0–18)
LIVER FRACTION: 14 % — AB (ref 18–85)

## 2024-07-15 ENCOUNTER — Ambulatory Visit: Payer: Self-pay | Admitting: Internal Medicine

## 2024-07-15 DIAGNOSIS — R748 Abnormal levels of other serum enzymes: Secondary | ICD-10-CM

## 2024-07-15 DIAGNOSIS — M810 Age-related osteoporosis without current pathological fracture: Secondary | ICD-10-CM

## 2024-07-29 ENCOUNTER — Encounter: Payer: Self-pay | Admitting: Endocrinology

## 2024-07-29 ENCOUNTER — Other Ambulatory Visit

## 2024-07-29 ENCOUNTER — Ambulatory Visit: Admitting: Endocrinology

## 2024-07-29 VITALS — BP 98/60 | HR 69 | Resp 20 | Ht 65.5 in | Wt 143.4 lb

## 2024-07-29 DIAGNOSIS — R7989 Other specified abnormal findings of blood chemistry: Secondary | ICD-10-CM

## 2024-07-29 DIAGNOSIS — M818 Other osteoporosis without current pathological fracture: Secondary | ICD-10-CM

## 2024-07-29 DIAGNOSIS — R748 Abnormal levels of other serum enzymes: Secondary | ICD-10-CM | POA: Diagnosis not present

## 2024-07-29 DIAGNOSIS — E041 Nontoxic single thyroid nodule: Secondary | ICD-10-CM

## 2024-07-29 DIAGNOSIS — E66811 Obesity, class 1: Secondary | ICD-10-CM

## 2024-07-29 MED ORDER — PHENTERMINE HCL 30 MG PO CAPS
30.0000 mg | ORAL_CAPSULE | ORAL | 3 refills | Status: DC
Start: 2024-07-29 — End: 2024-07-30

## 2024-07-29 NOTE — Patient Instructions (Signed)
  24-hr Urine Collection: - Preferably to be done while staying at home and start in the morning.  - Discard the first urine of the morning after you wake up because that was the urine from overnight. DO NOT COLLECT FIRST URINE. Then begin 24-hour urine collection.  Collect your urine every time you pee for next 24 hours which means all day and overnight until next morning.  You collect the last urine next morning, that COMPLETES the collection.

## 2024-07-29 NOTE — Progress Notes (Signed)
 Outpatient Endocrinology Note [place] Eidan Muellner, MD   Patient's Name: Kathleen Burton    DOB: May 18, 1961    MRN: [id number]  REASON OF VISIT: Follow-up for thyroid  nodule and weight management.  PCP: Amon Aloysius BRAVO, MD  HISTORY OF PRESENT ILLNESS:   Kathleen Burton is a 63 y.o. old female with past medical history as listed below is presented for a follow up for weight management and thyroid  nodule. Patient wants to evaluate for osteoporosis and hypercalcemia.   Pertinent  History:  Patient was previously seen by Dr. Von and was last time seen in August 2024.  # Weight management -Patient was following with Dr. Von in this clinic, endocrinologist for a long time for weight management.  She was initially tried with Qsymia  however due to lack of insurance coverage she was treated with equivalent doses in generic form.  She has been on long-term phentermine  and Topamax , were started in 2014.  She has been on phentermine  30 mg daily, dose was increased from 15 to 30 mg in June 2025 and Topamax  50 mg 2 tablets in the morning and 1 tablet in the evening.  Based on office note from prior endocrinologist Dr. Von she preferred to take 3 tablets of Topamax  instead of 2 tablets daily.  With the phentermine  and Topamax  patient has improved satiety, had significant weight loss when regimen was started.  She has been able to maintain weight loss when taking this combination of medications.  Patient states whenever she stopped taking this medication, she gains weight back rapidly.  No side effect with this regimen such as increased heart rate, nausea or drowsiness. Baseline BMI was 33 and improved to BMI of 23 with the medications.  She has been trying to follow a low-fat diet and avoid excessive portion of carbohydrates. Because of the hip pain she has not been able to do exercise or walking.  She is trying to do some chair exercises.  Weight has been fluctuating based on her compliance with  diet.  # Thyroid  nodule: Patient has isthmus nodule measuring 2.4 x 1.6 x 2.0 cm partially cystic with ultrasound in April 05, 2013, she underwent FNA of this nodule in July 2014 with benign cytology.  This thyroid  nodule has been monitored clinically, she did not have repeat ultrasound thyroid  since 2014.  She has occasional low TSH with normal free T4 and free T3 and most of the time normal thyroid  function test in the past. She has not been on thyroid  medication in the past.  She has not had a nuclear thyroid  scan.  She had normal TRAb in the past.  # She has vitamin D  deficiency, follow-up with primary care provider on vitamin D  supplement.  # Osteoporosis:  - With a review of chart noted to have DEXA scan in January 2024 by GYN, consistent with osteoporosis with lowest T-score of -3.3 at right hip, has not been on antiresorptive therapy.  Used to follow-up with GYN for osteoporosis management.  Interval history  Patient lost about 15 to 20 pound weight in last 3 months after increasing dose of phentermine .  She denies palpitation or any other complaints regarding phentermine .  She wants to continue phentermine  for long-term understanding the risks.  She has been taking phentermine  and Topamax  for weight loss.  She has osteoporosis and she wants it to be evaluated and treated in this clinic.  Recently she was evaluated by primary care provider had laboratory evaluation she was noted to have mildly  elevated serum calcium of 10.8, albumin 4.3 with corrected serum calcium of 10.6 mildly elevated.  She had normal calcium in the past except mildly elevated serum calcium at 10.8 in 2015.  She is also noted to have persistent elevated alkaline phosphatase.  She had normal liver enzymes.  She has normal electrolytes and acceptable renal function.  Parathyroid  hormone was normal 31 in 2016.  Patient vitamin D  level normal 45.  She has no fall and fracture.  No other complaints today.  REVIEW OF  SYSTEMS:  As per history of present illness.   PAST MEDICAL HISTORY: Past Medical History:  Diagnosis Date   Anemia    Dysmenorrhea    Glaucoma    Herpes    History of kidney stones    Thyroid  nodule    bx 04-2013    PAST SURGICAL HISTORY: Past Surgical History:  Procedure Laterality Date   BREAST EXCISIONAL BIOPSY Left 2004   BREAST SURGERY     biospy   EXTRACORPOREAL SHOCK WAVE LITHOTRIPSY Left 11/03/2016   Procedure: LEFT EXTRACORPOREAL SHOCK WAVE LITHOTRIPSY (ESWL);  Surgeon: Donnice Brooks, MD;  Location: [facility];  Service: Urology;  Laterality: Left;   FOOT SURGERY Bilateral    NOVASURE ABLATION     TOTAL HIP ARTHROPLASTY Right 12/2023    ALLERGIES: Allergies  Allergen Reactions   Latex Swelling    FAMILY HISTORY:  Family History  Problem Relation Age of Onset   Hypertension Mother        M- brother    Leukemia Mother        deceased   Diabetes Brother    Breast cancer Maternal Aunt        aunt-cousin   Hypothyroidism Sister        had thyroid  surgery   Breast cancer Cousin    HIV Son    Colon cancer Neg Hx    CAD Neg Hx     SOCIAL HISTORY: Social History   Socioeconomic History   Marital status: Married    Spouse name: Not on file   Number of children: 1   Years of education: Not on file   Highest education level: 12th grade  Occupational History   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    Employer: [employer]  Tobacco Use   Smoking status: Never   Smokeless tobacco: Never  Substance and Sexual Activity   Alcohol use: No   Drug use: No   Sexual activity: Yes  Other Topics Concern   Not on file  Social History Narrative   ** Merged History Encounter **       Lives w/ husband, child is at college   Social Drivers of Health   Financial Resource Strain: Low Risk  (07/03/2024)   Overall Financial Resource Strain (CARDIA)    Difficulty of Paying Living Expenses: Not very hard  Food Insecurity: No  Food Insecurity (07/03/2024)   Hunger Vital Sign    Worried About Running Out of Food in the Last Year: Never true    Ran Out of Food in the Last Year: Never true  Transportation Needs: No Transportation Needs (07/03/2024)   PRAPARE - Administrator, Civil Service (Medical): No    Lack of Transportation (Non-Medical): No  Physical Activity: Inactive (07/03/2024)   Exercise Vital Sign    Days of Exercise per Week: 0 days    Minutes of Exercise per Session: Not on file  Stress: No Stress Concern Present (  07/03/2024)   [employer] [employer] - Occupational Stress Questionnaire    Feeling of Stress: Only a little  Social Connections: Unknown (07/03/2024)   Social Connection and Isolation Panel    Frequency of Communication with Friends and Family: Twice a week    Frequency of Social Gatherings with Friends and Family: Not on file    Attends Religious Services: Never    Database administrator or Organizations: No    Attends Engineer, structural: Not on file    Marital Status: Married    MEDICATIONS:  Current Outpatient Medications  Medication Sig Dispense Refill   Cholecalciferol (VITAMIN D3) 50 MCG (2000 UT) capsule Take 1 capsule (2,000 Units total) by mouth daily.     latanoprost (XALATAN) 0.005 % ophthalmic solution 1 drop at bedtime.     Polyethyl Glyc-Propyl Glyc PF (SYSTANE PRESERVATIVE FREE) 0.4-0.3 % SOLN Apply to eye.     timolol (TIMOPTIC) 0.5 % ophthalmic solution 1 drop every morning.     topiramate  (TOPAMAX ) 50 MG tablet TAKE 1 TABLET BY MOUTH 3 TIMES  DAILY (Patient taking differently: Take 50 mg by mouth 2 (two) times daily.) 270 tablet 3   phentermine  30 MG capsule Take 1 capsule (30 mg total) by mouth every morning. 30 capsule 3   No current facility-administered medications for this visit.    PHYSICAL EXAM: Vitals:   07/29/24 1118  BP: 98/60  Pulse: 69  Resp: 20  SpO2: 99%  Weight: 143 lb 6.4 oz (65 kg)  Height: 5' 5.5  (1.664 m)     Body mass index is 23.5 kg/m.  Wt Readings from Last 3 Encounters:  07/29/24 143 lb 6.4 oz (65 kg)  07/05/24 144 lb 6 oz (65.5 kg)  03/28/24 158 lb 6.4 oz (71.8 kg)     General: Well developed, well nourished female in no apparent distress.  HEENT: AT/[place], no external lesions. Hearing intact to the spoken word Eyes: EOMI. No stare, proptosis. Conjunctiva clear and no icterus. Neck: Trachea midline, neck supple without appreciable thyromegaly or lymphadenopathy and + palpable isthmus thyroid  nodule, mobile and soft. Lungs: Clear to auscultation, no wheeze. Respirations not labored Heart: S1S2, Regular in rate and rhythm. No loud murmurs Abdomen: Soft, non tender, non distended Neurologic: Alert, oriented, normal speech, deep tendon biceps reflexes normal,  no gross focal neurological deficit Extremities: No pedal pitting edema, no tremors of outstretched hands Skin: Warm, color good.  Psychiatric: Does not appear depressed or anxious  PERTINENT HISTORIC LABORATORY AND IMAGING STUDIES:  All pertinent laboratory results were reviewed. Please see HPI also for further details.   TSH  Date Value Ref Range Status  03/25/2024 0.52 0.40 - 4.50 mIU/L Final  11/22/2023 0.34 (L) 0.40 - 4.50 mIU/L Final  05/30/2023 0.62 0.35 - 5.50 uIU/mL Final     ASSESSMENT / PLAN  1. Other osteoporosis without current pathological fracture   2. Thyroid  nodule   3. Hypercalcemia   4. Elevated alkaline phosphatase level   5. Obesity (BMI 30.0-34.9)   6. Low TSH level     # Obesity baseline BMI 33 -Patient has been on long-term combination medication phentermine  15 mg daily and Topamax  150 mg total per day.   -She has been able to lose and maintain body weight, current BMI 26.  Brand-name Qsymia  was not covered and not able to afford. -Lately she has not been able to do exercise due to recent right hip surgery. -Her current BMI 23.5, no other  cardiovascular risk factors. -Discussed  that FDA approved phentermine  for short-term use.  Phentermine  is a stimulant and it is approved for short-term use.  However some studies suggest it can be safe for long-term use as well.  Discussed that regarding phentermine , concern has been raised about addiction and generally recommended for short-term use.  Patient wants to continue phentermine  and Topamax  for long-term use, she understands the risks.  She mentioned that she cannot be off of this medications otherwise she would gain weight rapidly and detrimental for her health.  Plan: - Continue phentermine  30 mg daily.  Continue Topamax  150 mg total per day.  # Thyroid  nodule /normal thyroid  function test -Patient has isthmus thyroid  nodule measuring 2.4 cm, ultrasound in June 2014, status post FNA with benign cytology.  Thyroid  nodule has been monitored clinically.  No follow-up ultrasound has been done since 2014. -Patient has intermittently low TSH, with normal free T4 and free T3.  Patient has no hyperthyroid symptoms.  She has never been on thyroid  medication.  Thyrotropin receptor antibody negative.  Recent thyroid  function test normal..  Plan: -No indication and plan of medication for thyroid  at this time. - Check ultrasound thyroid  to monitor thyroid  nodules.  # Hypercholesterolemia : -Mildly elevated LDL , other lipids level were in the acceptable range.  Not a candidate for pharmacological treatment at this time.  Will continue to monitor.  # Severe vitamin D  deficiency : On vitamin D  supplement, managed by primary care provider.  Recently vitamin D  has normalized.  # Hypercalcemia /  Osteoporosis : -She had DEXA scan in January 2024 by GYN, consistent with osteoporosis with lowest T-score of -3.38 right dual femur all total. - Patient has not been on antiresorptive therapy.  She is currently taking vitamin D  supplement but no calcium supplement. - She has recent mildly elevated serum calcium and elevated alkaline phosphatase.   Unclear reason for elevated alkaline phosphatase however it can be related to recent low vitamin D  as well. - She needs additional laboratory work of for secondary causes of osteoporosis and hypercalcemia. - Check parathyroid  hormone, CMP, phosphorus and bone specific alkaline phosphatase. - Check 24-hour urine calcium and protein electrophoresis for workup of osteoporosis. - Will plan for treatment for osteoporosis after this test results as needed.  Diagnoses and all orders for this visit:  Other osteoporosis without current pathological fracture -     Calcium, 24-Hour Urine with Creatinine -     Serum protein electrophoresis with reflex  Thyroid  nodule -     US  THYROID ; Future  Hypercalcemia -     US  THYROID ; Future -     Parathyroid  hormone, intact (no Ca) -     Comprehensive metabolic panel with GFR -     Phosphorus  Elevated alkaline phosphatase level -     Alkaline phosphatase, bone specific  Obesity (BMI 30.0-34.9) -     phentermine  30 MG capsule; Take 1 capsule (30 mg total) by mouth every morning.  Low TSH level    DISPOSITION Follow up in clinic in 4 weeks suggested.  All questions answered and patient verbalized understanding of the plan.  [place] Saquan Furtick, MD [facility] Endocrinology [facility] [address] [place], [address] [place], [place] [address] Phone # [phone]  At least part of this note was generated using voice recognition software. Inadvertent word errors may have occurred, which were not recognized during the proofreading process.

## 2024-07-30 ENCOUNTER — Telehealth: Payer: Self-pay | Admitting: Endocrinology

## 2024-07-30 DIAGNOSIS — E66811 Obesity, class 1: Secondary | ICD-10-CM

## 2024-07-30 MED ORDER — PHENTERMINE HCL 30 MG PO CAPS: 30.0000 mg | ORAL_CAPSULE | ORAL | 3 refills | Status: DC

## 2024-07-30 NOTE — Telephone Encounter (Signed)
 Patient is calling to say that her medications were sent to the wrong pharmacies yesterday.  Patient states that the prescription for phentermine  30 MG capsule needs to go to  [facility] Address: [address] [place], [place], [place] [address] Phone: [phone] and NOT to [facility].  Also, patient wants to make sure that the prescription for topiramate  (TOPAMAX ) 50 MG tablet goes to [facility] [place], [place] - [address] (Ph: [phone]) .

## 2024-07-30 NOTE — Addendum Note (Signed)
 Addended by: Diamond Martucci, [place] on: 07/30/2024 03:10 PM   Modules accepted: Orders

## 2024-07-31 ENCOUNTER — Ambulatory Visit
Admission: RE | Admit: 2024-07-31 | Discharge: 2024-07-31 | Disposition: A | Discharge status: Home / Self Care | Source: Ambulatory Visit | Attending: Endocrinology | Admitting: Endocrinology

## 2024-07-31 ENCOUNTER — Other Ambulatory Visit

## 2024-07-31 DIAGNOSIS — E041 Nontoxic single thyroid nodule: Secondary | ICD-10-CM

## 2024-08-01 LAB — CALCIUM, 24-HOUR URINE WITH CREATININE
CALCIUM/CREATININE RATIO: 266 mg/g{creat} (ref 30–275)
Calcium, 24H Urine: 231 mg/(24.h)
Creatinine, 24H Ur: 0.87 g/(24.h) (ref 0.50–2.15)

## 2024-08-02 ENCOUNTER — Ambulatory Visit: Payer: Self-pay | Admitting: Endocrinology

## 2024-08-02 DIAGNOSIS — M818 Other osteoporosis without current pathological fracture: Secondary | ICD-10-CM

## 2024-08-02 DIAGNOSIS — R748 Abnormal levels of other serum enzymes: Secondary | ICD-10-CM

## 2024-08-02 LAB — COMPREHENSIVE METABOLIC PANEL WITH GFR
AG Ratio: 1.3 (calc) (ref 1.0–2.5)
ALT: 9 U/L (ref 6–29)
AST: 12 U/L (ref 10–35)
Albumin: 4.4 g/dL (ref 3.6–5.1)
Alkaline phosphatase (APISO): 192 U/L — ABNORMAL HIGH (ref 37–153)
BUN: 16 mg/dL (ref 7–25)
CO2: 22 mmol/L (ref 20–32)
Calcium: 11.1 mg/dL — ABNORMAL HIGH (ref 8.6–10.4)
Chloride: 111 mmol/L — ABNORMAL HIGH (ref 98–110)
Creat: 0.96 mg/dL (ref 0.50–1.05)
Globulin: 3.4 g/dL (ref 1.9–3.7)
Glucose, Bld: 84 mg/dL (ref 65–99)
Potassium: 4.4 mmol/L (ref 3.5–5.3)
Sodium: 141 mmol/L (ref 135–146)
Total Bilirubin: 0.4 mg/dL (ref 0.2–1.2)
Total Protein: 7.8 g/dL (ref 6.1–8.1)
eGFR: 67 mL/min/1.73m2 (ref >=60)

## 2024-08-02 LAB — ALKALINE PHOSPHATASE, BONE SPECIFIC: ALKALINE PHOSPHATASE, BONE SPECIFIC: 66.5 ug/L — ABNORMAL HIGH (ref 5.6–29.0)

## 2024-08-02 LAB — PROTEIN ELECTROPHORESIS, SERUM, WITH REFLEX
Albumin ELP: 4.2 g/dL (ref 3.8–4.8)
Alpha 1: 0.3 g/dL (ref 0.2–0.3)
Alpha 2: 0.8 g/dL (ref 0.5–0.9)
Beta 2: 0.4 g/dL (ref 0.2–0.5)
Beta Globulin: 0.4 g/dL (ref 0.4–0.6)
Gamma Globulin: 1.7 g/dL (ref 0.8–1.7)
Total Protein: 7.9 g/dL (ref 6.1–8.1)

## 2024-08-02 LAB — PARATHYROID HORMONE, INTACT (NO CA): PTH: 49 pg/mL (ref 16–77)

## 2024-08-02 LAB — IFE INTERPRETATION

## 2024-08-02 LAB — PHOSPHORUS: Phosphorus: 4 mg/dL (ref 2.5–4.5)

## 2024-08-06 ENCOUNTER — Other Ambulatory Visit: Payer: Self-pay | Admitting: Obstetrics and Gynecology

## 2024-08-06 DIAGNOSIS — Z1231 Encounter for screening mammogram for malignant neoplasm of breast: Secondary | ICD-10-CM

## 2024-08-09 NOTE — Progress Notes (Signed)
 Please notify patient of labs reviewed serum calcium 11.1 with albumin 4.4 and corrected serum calcium of 10.8, normal phosphorus.  Parathyroid  hormone in the mid normal range.  24-hour urine calcium normal.  Protein electrophoresis with no monoclonal protein detected.  Alkaline phosphatase elevated with elevated bone specific alkaline phosphatase.  She has hypercalcemia with normal PTH, in this context concerning for primary hyperparathyroidism as well.  Other differential include Paget's disease of bone.  For the additional evaluation of elevated alkaline phosphatase.  I would like to check 1, 25 dihydroxy vitamin D  level, repeat renal function panel, alkaline phosphatase, CTX  /bone turnover marker in the morning fasting.  Please arrange for lab visit 7 to 10 days prior to follow-up visit with me in November in the early morning fasting.

## 2024-08-16 NOTE — Telephone Encounter (Signed)
 Yes, schedule to be done at least by first week of November.

## 2024-08-22 ENCOUNTER — Other Ambulatory Visit

## 2024-08-25 LAB — RENAL FUNCTION PANEL
Albumin: 4.4 g/dL (ref 3.6–5.1)
BUN: 16 mg/dL (ref 7–25)
CO2: 20 mmol/L (ref 20–32)
Calcium: 10.9 mg/dL — ABNORMAL HIGH (ref 8.6–10.4)
Chloride: 111 mmol/L — ABNORMAL HIGH (ref 98–110)
Creat: 0.97 mg/dL (ref 0.50–1.05)
Glucose, Bld: 74 mg/dL (ref 65–99)
Phosphorus: 3.7 mg/dL (ref 2.5–4.5)
Potassium: 4.2 mmol/L (ref 3.5–5.3)
Sodium: 139 mmol/L (ref 135–146)

## 2024-08-25 LAB — PARATHYROID HORMONE, INTACT (NO CA): PTH: 38 pg/mL (ref 16–77)

## 2024-08-25 LAB — VITAMIN D 1,25 DIHYDROXY
Vitamin D 1, 25 (OH)2 Total: 46 pg/mL (ref 18–72)
Vitamin D2 1, 25 (OH)2: <8 pg/mL
Vitamin D3 1, 25 (OH)2: 46 pg/mL

## 2024-08-25 LAB — ALKALINE PHOSPHATASE: Alkaline phosphatase (APISO): 169 U/L — ABNORMAL HIGH (ref 37–153)

## 2024-08-25 LAB — C-TERMINAL TELOPEPTIDE: C-Telopeptide (CTx): 1744 pg/mL

## 2024-08-26 ENCOUNTER — Other Ambulatory Visit

## 2024-08-29 ENCOUNTER — Ambulatory Visit: Admitting: Endocrinology

## 2024-08-29 ENCOUNTER — Encounter: Payer: Self-pay | Admitting: Endocrinology

## 2024-08-29 VITALS — BP 110/72 | Resp 20 | Ht 65.5 in | Wt 143.2 lb

## 2024-08-29 DIAGNOSIS — E042 Nontoxic multinodular goiter: Secondary | ICD-10-CM | POA: Diagnosis not present

## 2024-08-29 DIAGNOSIS — E66811 Obesity, class 1: Secondary | ICD-10-CM | POA: Diagnosis not present

## 2024-08-29 DIAGNOSIS — M818 Other osteoporosis without current pathological fracture: Secondary | ICD-10-CM | POA: Diagnosis not present

## 2024-08-29 DIAGNOSIS — R748 Abnormal levels of other serum enzymes: Secondary | ICD-10-CM

## 2024-08-29 DIAGNOSIS — E210 Primary hyperparathyroidism: Secondary | ICD-10-CM | POA: Diagnosis not present

## 2024-08-29 NOTE — Progress Notes (Signed)
 Outpatient Endocrinology Note Dajai Wahlert, MD   Patient's Name: Kathleen Burton    DOB: 11/09/1960    MRN: [id number]  REASON OF VISIT: Follow-up for osteoporosis/hyperparathyroidism / thyroid  nodule and weight management.  PCP: Amon Aloysius BRAVO, MD  HISTORY OF PRESENT ILLNESS:   Kathleen Burton is a 63 y.o. old female with past medical history as listed below is presented for a follow up for osteoporosis/hyperparathyroidism.  Patient has also been following for weight management and thyroid  nodule.  Pertinent  History:  Patient was previously seen by Dr. Von and was last time seen in August 2024.  # Osteoporosis /hypercalcemia:  -Initial evaluation for osteoporosis and hypercalcemia was in October 2025.  Patient was following with OB/GYN for bone health/osteoporosis and patient requested for evaluation in this clinic.  - With a review of chart noted to have DEXA scan in January 2024 by GYN, consistent with osteoporosis with lowest T-score of -3.3 at right hip, has not been on antiresorptive therapy.  Used to follow-up with GYN for osteoporosis management.  Menopause around early 74s.  No fragility fractures.  No history of smoking, excessive alcohol intake or systemic glucocorticoid use.  Not on antiresorptive therapy for treatment of osteoporosis.  She has been taking vitamin D  supplement.  She has history of vitamin D  deficiency, vitamin D  normalized with a supplement.  Patient has mildly elevated serum calcium, noted in September 2025.  Laboratory evaluation in September 2025 showed serum calcium of 11.1 with albumin 4.4 and corrected serum calcium of 10.8, normal phosphorus.  Parathyroid  hormone in the mid normal range.  24-hour urine calcium normal 231.  Protein electrophoresis with no monoclonal protein detected.   She has elevated alkaline phosphatase with elevated bone specific alkaline phosphatase.  Additional laboratory evaluation in August 22, 2024 showed  corrected serum calcium 10.6, mildly elevated, elevated alkaline phosphatase, normal vitamin D1, 25 dihydroxy, parathyroid  hormone mid normal. CTX bone turnover markers significantly elevated 1744 indicating bone resorption. She has elevated bone specific alkaline phosphatase as well. Overall concerning for primary hyperparathyroidism causing resorption and turnover of bone and causing osteoporosis.   # Weight management -Patient was following with Dr. Von in this clinic, endocrinologist for a long time for weight management.  She was initially tried with Qsymia  however due to lack of insurance coverage she was treated with equivalent doses in generic form.  She has been on long-term phentermine  and Topamax , were started in 2014.  She has been on phentermine  30 mg daily, dose was increased from 15 to 30 mg in June 2025 and Topamax  50 mg 2 tablets in the morning and 1 tablet in the evening.  Based on office note from prior endocrinologist Dr. Von she preferred to take 3 tablets of Topamax  instead of 2 tablets daily.  With the phentermine  and Topamax  patient has improved satiety, had significant weight loss when regimen was started.  She has been able to maintain weight loss when taking this combination of medications.  Patient states whenever she stopped taking this medication, she gains weight back rapidly.  No side effect with this regimen such as increased heart rate, nausea or drowsiness. Baseline BMI was 33 and improved to BMI of 23 with the medications.  She has been trying to follow a low-fat diet and avoid excessive portion of carbohydrates. Because of the hip pain she has not been able to do exercise or walking.  She is trying to do some chair exercises.  Weight has been fluctuating based on  her compliance with diet.  # Thyroid  nodule: Patient has isthmus nodule measuring 2.4 x 1.6 x 2.0 cm partially cystic with ultrasound in April 05, 2013, she underwent FNA of this nodule in July 2014 with  benign cytology.   - Ultrasound thyroid  in October 2025 : Stable Isthmus thyroid  nodule 2.3 cm, this nodule was biopsied with benign cytology in the past. Right thyroid  nodule measuring 1.7 cm and other bilateral thyroid  nodules with no worrisome features.   She has occasional low TSH with normal free T4 and free T3 and most of the time normal thyroid  function test in the past. She has not been on thyroid  medication in the past.  She has not had a nuclear thyroid  scan.  She had normal TRAb in the past.  Interval history  Patient presented for the follow-up visit recently regarding recent laboratory evaluation for hypercalcemia and osteoporosis.  As reviewed above she has significantly elevated CTX and bone specific alkaline phosphate is concerning for bone resorption and osteoporosis with hypercalcemia overall concerning for primary hyperparathyroidism.  She has no fragility fracture.  She has normalized vitamin D  level on vitamin D  supplement.  She has been taking phentermine  and Topamax  separately for weight loss and weight management.  No other complaints today.  REVIEW OF SYSTEMS:  As per history of present illness.   PAST MEDICAL HISTORY: Past Medical History:  Diagnosis Date   Anemia    Dysmenorrhea    Glaucoma    Herpes    History of kidney stones    Thyroid  nodule    bx 04-2013    PAST SURGICAL HISTORY: Past Surgical History:  Procedure Laterality Date   BREAST EXCISIONAL BIOPSY Left 2004   BREAST SURGERY     biospy   EXTRACORPOREAL SHOCK WAVE LITHOTRIPSY Left 11/03/2016   Procedure: LEFT EXTRACORPOREAL SHOCK WAVE LITHOTRIPSY (ESWL);  Surgeon: Donnice Brooks, MD;  Location: [facility];  Service: Urology;  Laterality: Left;   FOOT SURGERY Bilateral    NOVASURE ABLATION     TOTAL HIP ARTHROPLASTY Right 12/2023    ALLERGIES: Allergies  Allergen Reactions   Latex Swelling    FAMILY HISTORY:  Family History  Problem Relation Age of Onset   Hypertension Mother         M- brother    Leukemia Mother        deceased   Diabetes Brother    Breast cancer Maternal Aunt        aunt-cousin   Hypothyroidism Sister        had thyroid  surgery   Breast cancer Cousin    HIV Son    Colon cancer Neg Hx    CAD Neg Hx     SOCIAL HISTORY: Social History   Socioeconomic History   Marital status: Married    Spouse name: Not on file   Number of children: 1   Years of education: Not on file   Highest education level: 12th grade  Occupational History   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    Employer: [employer]  Tobacco Use   Smoking status: Never   Smokeless tobacco: Never  Substance and Sexual Activity   Alcohol use: No   Drug use: No   Sexual activity: Yes  Other Topics Concern   Not on file  Social History Narrative   ** Merged History Encounter **       Lives w/ husband, child is at college   Social Drivers of Health  Financial Resource Strain: Low Risk  (07/03/2024)   Overall Financial Resource Strain (CARDIA)    Difficulty of Paying Living Expenses: Not very hard  Food Insecurity: No Food Insecurity (07/03/2024)   Hunger Vital Sign    Worried About Running Out of Food in the Last Year: Never true    Ran Out of Food in the Last Year: Never true  Transportation Needs: No Transportation Needs (07/03/2024)   PRAPARE - Administrator, Civil Service (Medical): No    Lack of Transportation (Non-Medical): No  Physical Activity: Inactive (07/03/2024)   Exercise Vital Sign    Days of Exercise per Week: 0 days    Minutes of Exercise per Session: Not on file  Stress: No Stress Concern Present (07/03/2024)   [employer] of Occupational Health - Occupational Stress Questionnaire    Feeling of Stress: Only a little  Social Connections: Unknown (07/03/2024)   Social Connection and Isolation Panel    Frequency of Communication with Friends and Family: Twice a week    Frequency of Social Gatherings  with Friends and Family: Not on file    Attends Religious Services: Never    Database Administrator or Organizations: No    Attends Engineer, Structural: Not on file    Marital Status: Married    MEDICATIONS:  Current Outpatient Medications  Medication Sig Dispense Refill   Cholecalciferol (VITAMIN D3) 50 MCG (2000 UT) capsule Take 1 capsule (2,000 Units total) by mouth daily.     latanoprost (XALATAN) 0.005 % ophthalmic solution 1 drop at bedtime.     phentermine  30 MG capsule Take 1 capsule (30 mg total) by mouth every morning. 30 capsule 3   Polyethyl Glyc-Propyl Glyc PF (SYSTANE PRESERVATIVE FREE) 0.4-0.3 % SOLN Apply to eye.     timolol (TIMOPTIC) 0.5 % ophthalmic solution 1 drop every morning.     topiramate  (TOPAMAX ) 50 MG tablet TAKE 1 TABLET BY MOUTH 3 TIMES  DAILY (Patient taking differently: Take 50 mg by mouth 2 (two) times daily.) 270 tablet 3   No current facility-administered medications for this visit.    PHYSICAL EXAM: Vitals:   08/29/24 1555  BP: 110/72  Resp: 20  Weight: 143 lb 3.2 oz (65 kg)  Height: 5' 5.5 (1.664 m)     Body mass index is 23.47 kg/m.  Wt Readings from Last 3 Encounters:  08/29/24 143 lb 3.2 oz (65 kg)  07/29/24 143 lb 6.4 oz (65 kg)  07/05/24 144 lb 6 oz (65.5 kg)     General: Well developed, well nourished female in no apparent distress.  HEENT: AT/[place], no external lesions. Hearing intact to the spoken word Eyes: EOMI. No stare, proptosis. Conjunctiva clear and no icterus. Neck: Trachea midline, neck supple without appreciable thyromegaly or lymphadenopathy and + palpable isthmus thyroid  nodule, mobile and soft. Lungs: Clear to auscultation, no wheeze. Respirations not labored Heart: S1S2, Regular in rate and rhythm. No loud murmurs Abdomen: Soft, non tender, non distended Neurologic: Alert, oriented, normal speech, deep tendon biceps reflexes normal,  no gross focal neurological deficit Extremities: No pedal pitting edema,  no tremors of outstretched hands Skin: Warm, color good.  Psychiatric: Does not appear depressed or anxious  PERTINENT HISTORIC LABORATORY AND IMAGING STUDIES:  All pertinent laboratory results were reviewed. Please see HPI also for further details.   TSH  Date Value Ref Range Status  03/25/2024 0.52 0.40 - 4.50 mIU/L Final  11/22/2023 0.34 (L) 0.40 - 4.50 mIU/L Final  05/30/2023 0.62 0.35 - 5.50 uIU/mL Final     ASSESSMENT / PLAN  1. Primary hyperparathyroidism   2. Other osteoporosis without current pathological fracture   3. Elevated alkaline phosphatase level   4. Hypercalcemia   5. Multiple thyroid  nodules   6. Obesity (BMI 30.0-34.9)    # Hypercalcemia /osteoporosis - She had DEXA scan in January 2024, consistent with osteoporosis with lowest T-score of -3.38 right dual femur all total. - Patient has mild hypercalcemia noted in September 2025, laboratory evaluation showed significantly elevated alkaline phosphatase/bone specific alkaline phosphatase, significantly elevated CTX/bone turnover marker.  She has made normal parathyroid  hormone and normal 24-hour urine calcium.  Normal protein electrophoresis.  Overall laboratory results concerning for primary hyperparathyroidism and osteoporosis.  Plan: - I would like to check sestamibi parathyroid  scan.  Order placed. - Discussed that if she is found to have parathyroid  adenoma, treatment is parathyroidectomy/surgical treatment. - Will plan for treatment for osteoporosis with antiresorptive therapy after evaluation and management of concerning primary hyperparathyroidism. -Okay to continue current dose of vitamin D  supplement. -Discussed about fall precaution and weightbearing exercise as tolerated.   # Obesity baseline BMI 33 -Patient has been on long-term combination medication phentermine  15 mg daily and Topamax  150 mg total per day.   -She has been able to lose and maintain body weight, current BMI 26.  Brand-name Qsymia   was not covered and not able to afford. -Lately she has not been able to do exercise due to recent right hip surgery. -Her current BMI 23.5, no other cardiovascular risk factors. -Discussed that FDA approved phentermine  for short-term use.  Phentermine  is a stimulant and it is approved for short-term use.  However some studies suggest it can be safe for long-term use as well.  Discussed that regarding phentermine , concern has been raised about addiction and generally recommended for short-term use.  Patient wants to continue phentermine  and Topamax  for long-term use, she understands the risks.  She mentioned that she cannot be off of this medications otherwise she would gain weight rapidly and detrimental for her health.  Plan: - Continue phentermine  30 mg daily.  Continue Topamax  150 mg total per day.  # Thyroid  nodules -Patient has isthmus thyroid  nodule measuring 2.4 cm, ultrasound in June 2014, status post FNA with benign cytology.  She occasionally had abnormal thyroid  function test.  She is mostly euthyroid and not on thyroid  medication. - Ultrasound thyroid  in October 2025 stable isthmus nodule measuring 2.3 cm, right thyroid  nodule 1.7 cm and bilateral oral nodules with no worrisome features. - Please nodule can be monitored with serial ultrasound, will plan to repeat ultrasound thyroid  in around 1 year around fall of 2026.  # Hypercholesterolemia : -Mildly elevated LDL , other lipids level were in the acceptable range.  Not a candidate for pharmacological treatment at this time.  Will continue to monitor.   Diagnoses and all orders for this visit:  Primary hyperparathyroidism -     NM Parathyroid  W/Spect/CT; Future  Other osteoporosis without current pathological fracture  Elevated alkaline phosphatase level  Hypercalcemia  Multiple thyroid  nodules  Obesity (BMI 30.0-34.9)   DISPOSITION Follow up in clinic in 2 months suggested.  All questions answered and patient  verbalized understanding of the plan.  Amaurie Schreckengost, MD [facility] [address] [place], [address] [place], [place] [address] Phone # [phone]  At least part of this note was generated using voice recognition software. Inadvertent word errors may have occurred, which were  not recognized during the proofreading process.

## 2024-09-09 ENCOUNTER — Encounter (HOSPITAL_COMMUNITY)

## 2024-09-16 ENCOUNTER — Ambulatory Visit (HOSPITAL_COMMUNITY)
Admission: RE | Admit: 2024-09-16 | Discharge: 2024-09-16 | Disposition: A | Source: Ambulatory Visit | Attending: Endocrinology

## 2024-09-16 ENCOUNTER — Other Ambulatory Visit: Payer: Self-pay | Admitting: Endocrinology

## 2024-09-16 DIAGNOSIS — E210 Primary hyperparathyroidism: Secondary | ICD-10-CM | POA: Insufficient documentation

## 2024-09-16 DIAGNOSIS — M818 Other osteoporosis without current pathological fracture: Secondary | ICD-10-CM

## 2024-09-16 DIAGNOSIS — E042 Nontoxic multinodular goiter: Secondary | ICD-10-CM

## 2024-09-16 DIAGNOSIS — E66811 Obesity, class 1: Secondary | ICD-10-CM

## 2024-09-16 DIAGNOSIS — R748 Abnormal levels of other serum enzymes: Secondary | ICD-10-CM

## 2024-09-16 MED ORDER — TECHNETIUM TC 99M SESTAMIBI - CARDIOLITE
26.7000 | Freq: Once | INTRAVENOUS | Status: AC | PRN
  Administered 2024-09-16: 26.7 via INTRAVENOUS

## 2024-09-20 ENCOUNTER — Ambulatory Visit
Admission: RE | Admit: 2024-09-20 | Discharge: 2024-09-20 | Disposition: A | Source: Ambulatory Visit | Attending: Obstetrics and Gynecology | Admitting: Obstetrics and Gynecology

## 2024-09-20 DIAGNOSIS — Z1231 Encounter for screening mammogram for malignant neoplasm of breast: Secondary | ICD-10-CM

## 2024-09-25 ENCOUNTER — Ambulatory Visit: Payer: Self-pay | Admitting: Endocrinology

## 2024-09-26 ENCOUNTER — Ambulatory Visit

## 2024-09-26 VITALS — BP 124/71 | HR 73 | Temp 97.9°F | Resp 13 | Ht 65.5 in | Wt 140.6 lb

## 2024-09-26 DIAGNOSIS — M810 Age-related osteoporosis without current pathological fracture: Secondary | ICD-10-CM

## 2024-09-26 DIAGNOSIS — R748 Abnormal levels of other serum enzymes: Secondary | ICD-10-CM

## 2024-09-26 NOTE — Progress Notes (Unsigned)
 Office Visit Note  Patient: Kathleen Burton             Date of Birth: [birth year]           MRN: [id number]             PCP: Amon Aloysius BRAVO, MD Referring: Amon Aloysius BRAVO, MD Visit Date: 09/26/2024 Occupation: Tech  Subjective:  No chief complaint on file.   History of Present Illness: Kathleen Burton is a 63 y.o. female ***     Activities of Daily Living:  Patient reports morning stiffness for 0 minutes.   Patient Denies nocturnal pain.  Difficulty dressing/grooming: Denies Difficulty climbing stairs: Denies Difficulty getting out of chair: Denies Difficulty using hands for taps, buttons, cutlery, and/or writing: Denies  Review of Systems  Constitutional:  Negative for fatigue.  HENT:  Negative for mouth sores and mouth dryness.   Eyes:  Negative for dryness.  Respiratory:  Negative for shortness of breath.   Cardiovascular:  Negative for chest pain and palpitations.  Gastrointestinal:  Positive for blood in stool. Negative for constipation and diarrhea.  Endocrine: Negative for increased urination.  Genitourinary:  Negative for involuntary urination.  Musculoskeletal:  Negative for joint pain, gait problem, joint pain, joint swelling, myalgias, muscle weakness, morning stiffness, muscle tenderness and myalgias.  Skin:  Negative for color change, rash, hair loss and sensitivity to sunlight.  Allergic/Immunologic: Negative for susceptible to infections.  Neurological:  Negative for dizziness and headaches.  Hematological:  Negative for swollen glands.  Psychiatric/Behavioral:  Positive for sleep disturbance. Negative for depressed mood. The patient is nervous/anxious.     PMFS History:  Patient Active Problem List   Diagnosis Date Noted   Osteoporosis 07/05/2024   Nephrolithiasis 09/16/2016   PCP NOTES >>>>>>>>>>>>>>>>>>>> 03/03/2016   Annual physical exam 05/13/2013   Thyroid  nodule 04/01/2013   Anemia 02/17/2012   Fibroids 02/17/2012    Past Medical  History:  Diagnosis Date   Anemia    Dysmenorrhea    Glaucoma    Herpes    History of kidney stones    Thyroid  nodule    bx 04-2013    Family History  Problem Relation Age of Onset   Hypertension Mother        M- brother    Leukemia Mother        deceased   Diabetes Brother    Breast cancer Maternal Aunt        aunt-cousin   Hypothyroidism Sister        had thyroid  surgery   Breast cancer Cousin    HIV Son    Colon cancer Neg Hx    CAD Neg Hx    Past Surgical History:  Procedure Laterality Date   BREAST EXCISIONAL BIOPSY Left 2004   BREAST SURGERY     biospy   EXTRACORPOREAL SHOCK WAVE LITHOTRIPSY Left 11/03/2016   Procedure: LEFT EXTRACORPOREAL SHOCK WAVE LITHOTRIPSY (ESWL);  Surgeon: Donnice Brooks, MD;  Location: [facility];  Service: Urology;  Laterality: Left;   FOOT SURGERY Bilateral    NOVASURE ABLATION     TOTAL HIP ARTHROPLASTY Right 12/2023   Social History[1] Social History   Social History Narrative   ** Merged History Encounter **       Lives w/ husband, child is at college     Immunization History  Administered Date(s) Administered   Influenza, Mdck, Trivalent,PF 6+ MOS(egg free) 06/22/2024   Influenza-Unspecified 07/22/2023   Moderna Sars-Covid-2 Vaccination 11/21/2019, 12/12/2019, 07/27/2020  Pfizer(Comirnaty)Fall Seasonal Vaccine 12 years and older 07/22/2023, 06/25/2024   Tdap 10/30/2023   Zoster Recombinant(Shingrix) 07/22/2023, 09/23/2023     Objective: Vital Signs: There were no vitals taken for this visit.   Physical Exam   Musculoskeletal Exam: ***  CDAI Exam: CDAI Score: -- Patient Global: --; Provider Global: -- Swollen: --; Tender: -- Joint Exam 09/26/2024   No joint exam has been documented for this visit   There is currently no information documented on the homunculus. Go to the Rheumatology activity and complete the homunculus joint exam.  Investigation: No additional findings.  Imaging: NM Parathyroid   W/Spect Result Date: 09/21/2024 CLINICAL DATA:  Primary hyperparathyroidism. History of thyroid  nodule. EXAM: NM PARATHYROID  SCINTIGRAPHY AND SPECT IMAGING TECHNIQUE: Following intravenous administration of radiopharmaceutical, early and 2-hour delayed planar images were obtained in the anterior projection. Delayed triplanar SPECT images (without CT) were also obtained at 2 hours. RADIOPHARMACEUTICALS:  21.7 mCi Tc-76m Sestamibi IV COMPARISON:  Thyroid  ultrasound 07/31/2024 and 04/05/2013. FINDINGS: Immediate post injection planar images demonstrate homogeneous thyroid  uptake. There is physiologic uptake within the salivary glands. No abnormal uptake is demonstrated within the neck or superior mediastinum. Delayed phase planar images demonstrate symmetric partial washout of the thyroid  activity. There is mildly retained uptake inferiorly in the left thyroid  lobe which may correspond with one of the nodule seen on ultrasound. There is no residual abnormal uptake elsewhere within the neck or superior mediastinum. Delayed phase SPECT images demonstrate no abnormal uptake within the paratracheal regions. As above, mildly asymmetric residual activity along the inferior aspect of the left thyroid  lobe, best seen on the coronal images. No abnormal uptake elsewhere within the neck or superior mediastinum. IMPRESSION: 1. No definitive parathyroid  adenoma localized within the neck or superior mediastinum. 2. Nonspecific mild asymmetric early retained activity along the inferior aspect of the left thyroid  lobe, possibly related to a thyroid  nodule seen on ultrasound. Electronically Signed   By: Elsie Perone M.D.   On: 09/21/2024 10:38    Recent Labs: Lab Results  Component Value Date   WBC 3.6 (L) 10/30/2023   HGB 11.8 (L) 10/30/2023   PLT 360.0 10/30/2023   NA 139 08/22/2024   K 4.2 08/22/2024   CL 111 (H) 08/22/2024   CO2 20 08/22/2024   GLUCOSE 74 08/22/2024   BUN 16 08/22/2024   CREATININE 0.97  08/22/2024   BILITOT 0.4 07/29/2024   ALKPHOS 219 (H) 07/05/2024   ALKPHOS 191 (H) 07/05/2024   AST 12 07/29/2024   ALT 9 07/29/2024   PROT 7.8 07/29/2024   PROT 7.9 07/29/2024   ALBUMIN 4.3 07/05/2024   CALCIUM 10.9 (H) 08/22/2024    Speciality Comments: No specialty comments available.  Procedures:  No procedures performed Allergies: Latex   Assessment / Plan:     Visit Diagnoses: No diagnosis found.  Orders: No orders of the defined types were placed in this encounter.  No orders of the defined types were placed in this encounter.   Face-to-face time spent with patient was *** minutes. Greater than 50% of time was spent in counseling and coordination of care.  Follow-Up Instructions: No follow-ups on file.   Alfonso Patterson, LPN  Note - This record has been created using [employer].  Chart creation errors have been sought, but may not always  have been located. Such creation errors do not reflect on  the standard of medical care.    [1]  Social History Tobacco Use   Smoking status: Never   Smokeless  tobacco: Never  Substance Use Topics   Alcohol use: No   Drug use: No

## 2024-09-27 LAB — KAPPA/LAMBDA LIGHT CHAINS
Kappa free light chain: 35.6 mg/L — ABNORMAL HIGH (ref 3.3–19.4)
Kappa:Lambda Ratio: 2.6 — ABNORMAL HIGH (ref 0.26–1.65)
Lambda Free Lght Chn: 13.7 mg/L (ref 5.7–26.3)

## 2024-09-27 LAB — ANGIOTENSIN CONVERTING ENZYME: Angiotensin-Converting Enzyme: 36 U/L (ref 9–67)

## 2024-10-04 ENCOUNTER — Encounter (HOSPITAL_COMMUNITY): Payer: Self-pay

## 2024-10-04 ENCOUNTER — Encounter (HOSPITAL_COMMUNITY)
Admission: RE | Admit: 2024-10-04 | Discharge: 2024-10-04 | Disposition: A | Discharge status: Home / Self Care | Source: Ambulatory Visit

## 2024-10-04 DIAGNOSIS — R748 Abnormal levels of other serum enzymes: Secondary | ICD-10-CM | POA: Diagnosis present

## 2024-10-04 MED ORDER — TECHNETIUM TC 99M MEDRONATE IV KIT
20.0000 | PACK | Freq: Once | INTRAVENOUS | Status: AC
  Administered 2024-10-04: 19.7 via INTRAVENOUS

## 2024-10-14 ENCOUNTER — Ambulatory Visit: Payer: Self-pay

## 2024-11-04 ENCOUNTER — Ambulatory Visit: Payer: 59 | Admitting: Internal Medicine

## 2024-11-04 ENCOUNTER — Encounter: Payer: Self-pay | Admitting: Internal Medicine

## 2024-11-04 VITALS — BP 116/66 | HR 66 | Temp 97.9°F | Resp 16 | Ht 65.5 in | Wt 139.4 lb

## 2024-11-04 DIAGNOSIS — Z114 Encounter for screening for human immunodeficiency virus [HIV]: Secondary | ICD-10-CM | POA: Diagnosis not present

## 2024-11-04 DIAGNOSIS — Z1159 Encounter for screening for other viral diseases: Secondary | ICD-10-CM

## 2024-11-04 DIAGNOSIS — Z0000 Encounter for general adult medical examination without abnormal findings: Secondary | ICD-10-CM

## 2024-11-04 LAB — CBC WITH DIFFERENTIAL/PLATELET
Basophils Absolute: 0 K/uL (ref 0.0–0.1)
Basophils Relative: 1 % (ref 0.0–3.0)
Eosinophils Absolute: 0 K/uL (ref 0.0–0.7)
Eosinophils Relative: 1.1 % (ref 0.0–5.0)
HCT: 37.4 % (ref 36.0–46.0)
Hemoglobin: 12.2 g/dL (ref 12.0–15.0)
Lymphocytes Relative: 34.2 % (ref 12.0–46.0)
Lymphs Abs: 1.4 K/uL (ref 0.7–4.0)
MCHC: 32.6 g/dL (ref 30.0–36.0)
MCV: 80.2 fl (ref 78.0–100.0)
Monocytes Absolute: 0.3 K/uL (ref 0.1–1.0)
Monocytes Relative: 6.7 % (ref 3.0–12.0)
Neutro Abs: 2.3 K/uL (ref 1.4–7.7)
Neutrophils Relative %: 57 % (ref 43.0–77.0)
Platelets: 337 K/uL (ref 150.0–400.0)
RBC: 4.66 Mil/uL (ref 3.87–5.11)
RDW: 14 % (ref 11.5–15.5)
WBC: 4.1 K/uL (ref 4.0–10.5)

## 2024-11-04 LAB — LIPID PANEL
Cholesterol: 200 mg/dL (ref 28–200)
HDL: 71 mg/dL
LDL Cholesterol: 119 mg/dL — ABNORMAL HIGH (ref 10–99)
NonHDL: 128.63
Total CHOL/HDL Ratio: 3
Triglycerides: 50 mg/dL (ref 10.0–149.0)
VLDL: 10 mg/dL (ref 0.0–40.0)

## 2024-11-04 LAB — HEMOGLOBIN A1C: Hgb A1c MFr Bld: 5.6 % (ref 4.6–6.5)

## 2024-11-04 NOTE — Assessment & Plan Note (Signed)
 Here for CPX -Tdap t 2025 - Had a shingrex @ [facility], no records  -Had a recent flu shot - Ha a COVID-vaccine ~ 06/2024 per pt  - [facility]: colonoscopy July 03, 2020, normal, per GI letter, Dr. Kristie : next 5 years, pt aware   Available labs reviewed: Check FLP CBC A1c.  Screening for HIV and hep C. Female care: - Pap smear 11/29/23 ([facility]) -MMG: 09/20/24 [facility] Diet exercise discussed, she is doing well.

## 2024-11-04 NOTE — Progress Notes (Signed)
 "  Subjective:    Patient ID: Kathleen Burton, female    DOB: 1961/03/02, 64 y.o.   MRN: [id number]  DOS:  11/04/2024 CPX  Discussed the use of AI scribe software for clinical note transcription with the patient, who gave verbal consent to proceed.  History of Present Illness Kathleen Burton is a 64 year old female who presents for an annual physical exam.  Here for CPX, feeling well.  Trying to stay active and exercise regularly. Mild intentional weight loss.  Constitutional and gastrointestinal symptoms - No fever, chills, nausea, vomiting, diarrhea, or blood in stools  Gynecologic symptoms - No vaginal bleeding or discharge  Cardiopulmonary symptoms - No chest pain or respiratory symptoms   Wt Readings from Last 3 Encounters:  11/04/24 139 lb 6 oz (63.2 kg)  09/26/24 140 lb 9.6 oz (63.8 kg)  08/29/24 143 lb 3.2 oz (65 kg)    Review of Systems  Other than above, a 14 point review of systems is negative   Past Medical History:  Diagnosis Date   Anemia    Dysmenorrhea    Glaucoma    Herpes    History of kidney stones    Thyroid  nodule    bx 04-2013    Past Surgical History:  Procedure Laterality Date   BREAST EXCISIONAL BIOPSY Left 2004   BREAST SURGERY     biospy   EXTRACORPOREAL SHOCK WAVE LITHOTRIPSY Left 11/03/2016   Procedure: LEFT EXTRACORPOREAL SHOCK WAVE LITHOTRIPSY (ESWL);  Surgeon: Donnice Brooks, MD;  Location: [facility];  Service: Urology;  Laterality: Left;   FOOT SURGERY Bilateral    NOVASURE ABLATION     TOTAL HIP ARTHROPLASTY Right 12/2023    Current Outpatient Medications  Medication Instructions   latanoprost (XALATAN) 0.005 % ophthalmic solution 1 drop, Daily at bedtime   phentermine  30 mg, Oral, BH-each morning   Polyethyl Glyc-Propyl Glyc PF (SYSTANE PRESERVATIVE FREE) 0.4-0.3 % SOLN Apply to eye.   timolol (TIMOPTIC) 0.5 % ophthalmic solution 1 drop, Every morning   topiramate  (TOPAMAX ) 50 mg, Oral, 3 times daily   Vitamin  D3 2,000 Units, Oral, Daily       Objective:   Physical Exam BP 116/66   Pulse 66   Temp 97.9 F (36.6 C) (Oral)   Resp 16   Ht 5' 5.5 (1.664 m)   Wt 139 lb 6 oz (63.2 kg)   BMI 22.84 kg/m  General: Well developed, NAD, BMI noted Neck: No  thyromegaly  HEENT:  Normocephalic . Face symmetric, atraumatic Lungs:  CTA B Normal respiratory effort, no intercostal retractions, no accessory muscle use. Heart: RRR,  no murmur.  Abdomen:  Not distended, soft, non-tender. No rebound or rigidity.   Lower extremities: no pretibial edema bilaterally  Skin: Exposed areas without rash. Not pale. Not jaundice Neurologic:  alert & oriented X3.  Speech normal, gait appropriate for age and unassisted Strength symmetric and appropriate for age.  Psych: Cognition and judgment appear intact.  Cooperative with normal attention span and concentration.  Behavior appropriate. No anxious or depressed appearing.     Assessment   Assessment Thyroid  nodule, BX 2014 benign, sees Dr Von Dysmenorrhea, NovaSure ablation Osteoporosis: T-score -3.3 (10-2022) Herpes Glaucoma Increased alkaline phosphatase   Assessment & Plan Here for CPX -Tdap t 2025 - Had a shingrex @ [facility], no records  -Had a recent flu shot - Ha a COVID-vaccine ~ 06/2024 per pt  - [facility]: colonoscopy July 03, 2020, normal, per GI letter, Dr.  Mann : next 5 years, pt aware   Available labs reviewed: Check FLP CBC A1c.  Screening for HIV and hep C. Female care: - Pap smear 11/29/23 ([facility]) -MMG: 09/20/24 [facility] Diet exercise discussed, she is doing well.  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   Has a follow-up with rheumatology next month Hyperparathyroidism: per endo, parathyroid  nuclear scan with no obvious adenoma. Osteoporosis: Per Endo  RTC 1 year       "

## 2024-11-04 NOTE — Assessment & Plan Note (Signed)
 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   Has a follow-up with rheumatology next month Hyperparathyroidism: per endo, parathyroid  nuclear scan with no obvious adenoma. Osteoporosis: Per Endo  RTC 1 year

## 2024-11-04 NOTE — Patient Instructions (Signed)
 Please read your instructions carefully.   GO TO THE LAB :  Get the blood work    Go to the front desk for the checkout Please make an appointment for a physical exam in 1 year   You are due for your next colonoscopy 06/2025

## 2024-11-05 LAB — HIV ANTIBODY (ROUTINE TESTING W REFLEX)
HIV 1&2 Ab, 4th Generation: NONREACTIVE
HIV FINAL INTERPRETATION: NEGATIVE

## 2024-11-05 LAB — HEPATITIS C ANTIBODY: Hepatitis C Ab: NONREACTIVE

## 2024-11-05 NOTE — Progress Notes (Unsigned)
 "  Office Visit Note  Patient: Kathleen Burton             Date of Birth: 11-16-1960           MRN: [id number]             PCP: Amon Aloysius BRAVO, MD Referring: Amon Aloysius BRAVO, MD Visit Date: 11/19/2024 Occupation: Tech  Subjective:  No chief complaint on file.   History of Present Illness: Kathleen Burton is a 64 y.o. female with alkaline phosphatase elevation, osteoporosis and hypercalcemia who presents to discuss test results. Patient was last seen on 09/26/2024 where labs and a whole body bone scan were ordered.    Discussed the use of AI scribe software for clinical note transcription with the patient, who gave verbal consent to proceed.  History of Present Illness Patient returns today to discuss results of imaging test.  She is scheduled to receive a Reclast infusion on February 9th to treat both osteoporosis and Paget's disease. She has been informed about potential side effects, including flu-like symptoms that may last up to a week. She has no scheduled dental work.  Her Paget's disease was incidentally discovered in the pelvis during a workup for elevated alkaline phosphatase levels. She is currently asymptomatic with no bone pain, which is common in Paget's disease when discovered incidentally. She continues to deny bone pain or any other symptoms.      Activities of Daily Living:  Patient reports morning stiffness for 0 minute.   Patient Denies nocturnal pain.  Difficulty dressing/grooming: Denies Difficulty climbing stairs: Denies Difficulty getting out of chair: Denies Difficulty using hands for taps, buttons, cutlery, and/or writing: Denies  Review of Systems  Constitutional:  Negative for fatigue.  HENT:  Negative for mouth sores and mouth dryness.   Eyes:  Negative for dryness.  Respiratory:  Negative for shortness of breath.   Cardiovascular:  Negative for chest pain and palpitations.  Gastrointestinal:  Negative for blood in stool, constipation and  diarrhea.  Endocrine: Negative for increased urination.  Genitourinary:  Negative for involuntary urination.  Musculoskeletal:  Negative for joint pain, gait problem, joint pain, joint swelling, myalgias, muscle weakness, morning stiffness, muscle tenderness and myalgias.  Skin:  Negative for color change, rash, hair loss and sensitivity to sunlight.  Allergic/Immunologic: Negative for susceptible to infections.  Neurological:  Negative for dizziness and headaches.  Hematological:  Negative for swollen glands.  Psychiatric/Behavioral:  Positive for sleep disturbance. Negative for depressed mood. The patient is not nervous/anxious.     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   Osteoporosis 07/05/2024   Nephrolithiasis 09/16/2016   PCP NOTES >>>>>>>>>>>>>>>>>>>> 03/03/2016   Annual physical exam 05/13/2013   Thyroid  nodule 04/01/2013   Anemia 02/17/2012   Fibroids 02/17/2012    Past Medical History:  Diagnosis Date   Anemia    Dysmenorrhea    Glaucoma    Herpes    History of kidney stones    Thyroid  nodule    bx 04-2013    Family History  Problem Relation Age of Onset   Hypertension Mother        M- brother    Leukemia Mother        deceased   Glaucoma Father    Hypothyroidism Sister        had thyroid  surgery   Glaucoma Sister    Glaucoma Sister    Diabetes Brother  Glaucoma Brother    HIV Son    Breast cancer Maternal Aunt        aunt-cousin   Breast cancer Cousin    Colon cancer Neg Hx    CAD Neg Hx    Past Surgical History:  Procedure Laterality Date   BREAST EXCISIONAL BIOPSY Left 2004   BREAST SURGERY     biospy   EXTRACORPOREAL SHOCK WAVE LITHOTRIPSY Left 11/03/2016   Procedure: LEFT EXTRACORPOREAL SHOCK WAVE LITHOTRIPSY (ESWL);  Surgeon: Donnice Brooks, MD;  Location: [facility];  Service: Urology;  Laterality: Left;   FOOT SURGERY Bilateral    NOVASURE ABLATION     TOTAL HIP  ARTHROPLASTY Right 12/2023   Social History[1] Social History   Social History Narrative   ** Merged History Encounter **       Lives w/ husband, child is at college     Immunization History  Administered Date(s) Administered   Influenza, Mdck, Trivalent,PF 6+ MOS(egg free) 06/22/2024   Influenza-Unspecified 07/22/2023   Moderna Sars-Covid-2 Vaccination 11/21/2019, 12/12/2019, 07/27/2020   Pfizer(Comirnaty)Fall Seasonal Vaccine 12 years and older 07/22/2023, 06/25/2024   Tdap 10/30/2023   Zoster Recombinant(Shingrix) 07/22/2023, 09/23/2023     Objective: Vital Signs: BP 101/68   Pulse 63   Temp 97.7 F (36.5 C)   Resp 14   Ht 5' 5.5 (1.664 m)   Wt 136 lb 12.8 oz (62.1 kg)   BMI 22.42 kg/m    Physical Exam Vitals and nursing note reviewed.  HENT:     Head: Normocephalic and atraumatic.     Nose: Nose normal.  Eyes:     Conjunctiva/sclera: Conjunctivae normal.     Pupils: Pupils are equal, round, and reactive to light.  Pulmonary:     Effort: Pulmonary effort is normal. No respiratory distress.  Skin:    General: Skin is warm and dry.  Neurological:     Mental Status: She is alert. Mental status is at baseline.  Psychiatric:        Mood and Affect: Mood normal.        Behavior: Behavior normal.      Musculoskeletal Exam:   CDAI Exam: CDAI Score: -- Patient Global: --; Provider Global: -- Swollen: --; Tender: -- Joint Exam 11/19/2024   No joint exam has been documented for this visit   There is currently no information documented on the homunculus. Go to the Rheumatology activity and complete the homunculus joint exam.  Investigation: No additional findings.  Imaging: No results found.  Recent Labs: Lab Results  Component Value Date   WBC 4.1 11/04/2024   HGB 12.2 11/04/2024   PLT 337.0 11/04/2024   NA 143 11/06/2024   K 5.1 11/06/2024   CL 114 (H) 11/06/2024   CO2 22 11/06/2024   GLUCOSE 78 11/06/2024   BUN 16 11/06/2024   CREATININE  0.84 11/06/2024   BILITOT 0.4 07/29/2024   ALKPHOS 219 (H) 07/05/2024   ALKPHOS 191 (H) 07/05/2024   AST 12 07/29/2024   ALT 9 07/29/2024   PROT 7.8 07/29/2024   PROT 7.9 07/29/2024   ALBUMIN 4.3 07/05/2024   CALCIUM 11.3 (H) 11/06/2024    Speciality Comments: No specialty comments available.  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 Luckily, patient already planning to start Reclast per Endocrinology for Osteoporosis, which is what I would recommend  for treatment of this patient's asymptomatic paget's disease given location of lesions. Discussed new diagnosis with patient. Agree with plan per Endocrinology.  Elevated Kappa/Lambda Ratio Patient with mildly elevated K/L ratio. IFE and SPEP wnl, which is very reassuring. Unclear etiology, however suspect may be related to underlying Paget's disease with some studies suggesting pts with Paget's disease may demonstrate immunoglobulin abnormalities. Will repeat at follow-up, if remains elevated, may send to hematology/oncology for further evaluation.  Orders: No orders of the defined types were placed in this encounter.  No orders of the defined types were placed in this encounter.  I personally spent a total of 20 minutes in the care of the patient today including preparing to see the patient, getting/reviewing separately obtained history, performing a medically appropriate exam/evaluation, counseling and educating, documenting clinical information in the EHR, independently interpreting results, and communicating results.   Follow-Up Instructions: Return in about 6 months (around 05/19/2025).   Asberry Claw, DO  Note - This record has been created using Animal nutritionist.  Chart creation errors have been sought, but may not always  have been located. Such creation errors do not reflect on   the standard of medical care.     [1]  Social History Tobacco Use   Smoking status: Never    Passive exposure: Never   Smokeless tobacco: Never  Vaping Use   Vaping status: Never Used  Substance Use Topics   Alcohol use: No   Drug use: No   "

## 2024-11-06 ENCOUNTER — Encounter: Payer: Self-pay | Admitting: Endocrinology

## 2024-11-06 ENCOUNTER — Ambulatory Visit: Payer: Self-pay | Admitting: Internal Medicine

## 2024-11-06 ENCOUNTER — Other Ambulatory Visit

## 2024-11-06 ENCOUNTER — Ambulatory Visit: Admitting: Endocrinology

## 2024-11-06 VITALS — BP 100/60 | HR 79 | Resp 16 | Ht 65.5 in | Wt 138.0 lb

## 2024-11-06 DIAGNOSIS — M8888 Osteitis deformans of other bones: Secondary | ICD-10-CM | POA: Insufficient documentation

## 2024-11-06 DIAGNOSIS — R748 Abnormal levels of other serum enzymes: Secondary | ICD-10-CM | POA: Diagnosis not present

## 2024-11-06 DIAGNOSIS — E210 Primary hyperparathyroidism: Secondary | ICD-10-CM

## 2024-11-06 DIAGNOSIS — E042 Nontoxic multinodular goiter: Secondary | ICD-10-CM | POA: Diagnosis not present

## 2024-11-06 DIAGNOSIS — M818 Other osteoporosis without current pathological fracture: Secondary | ICD-10-CM

## 2024-11-06 DIAGNOSIS — E66811 Obesity, class 1: Secondary | ICD-10-CM

## 2024-11-06 MED ORDER — PHENTERMINE HCL 30 MG PO CAPS: 30.0000 mg | ORAL_CAPSULE | ORAL | 3 refills | Status: DC

## 2024-11-06 NOTE — Patient Instructions (Signed)
 Take calcium 500 mg daily.  Zoledronic Acid Injection (Bone Disorders) What is this medication? ZOLEDRONIC ACID (ZOE le dron ik AS id) prevents and treats osteoporosis. It may also be used to treat Paget's disease of the bone. It works by interior and spatial designer stronger and less likely to break (fracture). It belongs to a group of medications called bisphosphonates. This medicine may be used for other purposes; ask your health care provider or pharmacist if you have questions. COMMON BRAND NAME(S): Reclast What should I tell my care team before I take this medication? They need to know if you have any of these conditions: Bleeding disorder Cancer Dental disease Kidney disease Low levels of calcium in the blood Low red blood cell counts Lung or breathing disease, such as asthma Receiving steroids, such as dexamethasone or prednisone An unusual or allergic reaction to zoledronic acid, other medications, foods, dyes, or preservatives Pregnant or trying to get pregnant Breast-feeding How should I use this medication? This medication is injected into a vein. It is given by your care team in a hospital or clinic setting. A special MedGuide will be given to you before each treatment. Be sure to read this information carefully each time. Talk to your care team about the use of this medication in children. Special care may be needed. Overdosage: If you think you have taken too much of this medicine contact a poison control center or emergency room at once. NOTE: This medicine is only for you. Do not share this medicine with others. What if I miss a dose? Keep appointments for follow-up doses. It is important not to miss your dose. Call your care team if you are unable to keep an appointment. What may interact with this medication? Certain antibiotics given by injection Medications for pain and inflammation, such as ibuprofen, naproxen, NSAIDs Some diuretics, such as bumetanide,  furosemide Teriparatide This list may not describe all possible interactions. Give your health care provider a list of all the medicines, herbs, non-prescription drugs, or dietary supplements you use. Also tell them if you smoke, drink alcohol, or use illegal drugs. Some items may interact with your medicine. What should I watch for while using this medication? Visit your care team for regular checks on your progress. It may be some time before you see the benefit from this medication. Some people who take this medication have severe bone, joint, or muscle pain. This medication may also increase your risk for jaw problems or a broken thigh bone. Tell your care team right away if you have severe pain in your jaw, bones, joints, or muscles. Tell your care team if you have any pain that does not go away or that gets worse. You should make sure you get enough calcium and vitamin D  while you are taking this medication. Discuss the foods you eat and the vitamins you take with your care team. You may need bloodwork while taking this medication. Tell your dentist and dental surgeon that you are taking this medication. You should not have major dental surgery while on this medication. See your dentist to have a dental exam and fix any dental problems before starting this medication. Take good care of your teeth while on this medication. Make sure you see your dentist for regular follow-up appointments. What side effects may I notice from receiving this medication? Side effects that you should report to your care team as soon as possible: Allergic reactions--skin rash, itching, hives, swelling of the face, lips, tongue, or throat Kidney  injury--decrease in the amount of urine, swelling of the ankles, hands, or feet Low calcium level--muscle pain or cramps, confusion, tingling, or numbness in the hands or feet Osteonecrosis of the jaw--pain, swelling, or redness in the mouth, numbness of the jaw, poor healing  after dental work, unusual discharge from the mouth, visible bones in the mouth Severe bone, joint, or muscle pain Side effects that usually do not require medical attention (report to your care team if they continue or are bothersome): Diarrhea Dizziness Headache Nausea Stomach pain Vomiting This list may not describe all possible side effects. Call your doctor for medical advice about side effects. You may report side effects to FDA at 1-800-FDA-1088. Where should I keep my medication? This medication is given in a hospital or clinic. It will not be stored at home. NOTE: This sheet is a summary. It may not cover all possible information. If you have questions about this medicine, talk to your doctor, pharmacist, or health care provider.  2024 Elsevier/Gold Standard (2021-11-19 00:00:00)

## 2024-11-06 NOTE — Progress Notes (Unsigned)
 "  Outpatient Endocrinology Note Kathleen Grainger, MD   Patient's Name: Kathleen Burton    DOB: Sep 25, 1961    MRN: [id number]  REASON OF VISIT: Follow-up for osteoporosis/hyperparathyroidism / thyroid  nodule and weight management.  PCP: Amon Aloysius BRAVO, MD  HISTORY OF PRESENT ILLNESS:   Kathleen Burton is a 64 y.o. old female with past medical history as listed below is presented for a follow up for osteoporosis/hyperparathyroidism.  Patient has also been following for weight management and thyroid  nodule.  Pertinent  History:  Patient was previously seen by Dr. Von and was last time seen in August 2024.  # Osteoporosis /hypercalcemia:  -Initial evaluation for osteoporosis and hypercalcemia was in October 2025.  Patient was following with OB/GYN for bone health/osteoporosis and patient requested for evaluation in this clinic.  - With a review of chart noted to have DEXA scan in January 2024 by GYN, consistent with osteoporosis with lowest T-score of -3.3 at right hip, has not been on antiresorptive therapy.  Used to follow-up with GYN for osteoporosis management.  Menopause around early 18s.  No fragility fractures.  No history of smoking, excessive alcohol intake or systemic glucocorticoid use.  Not on antiresorptive therapy for treatment of osteoporosis.  She has been taking vitamin D  supplement.  She has history of vitamin D  deficiency, vitamin D  normalized with a supplement.  Patient has mildly elevated serum calcium, noted in September 2025.  Laboratory evaluation in September 2025 showed serum calcium of 11.1 with albumin 4.4 and corrected serum calcium of 10.8, normal phosphorus.  Parathyroid  hormone in the mid normal range.  24-hour urine calcium normal 231.  Protein electrophoresis with no monoclonal protein detected.   She has elevated alkaline phosphatase with elevated bone specific alkaline phosphatase.  Additional laboratory evaluation in August 22, 2024 showed  corrected serum calcium 10.6, mildly elevated, elevated alkaline phosphatase, normal vitamin D1, 25 dihydroxy, parathyroid  hormone mid normal. CTX bone turnover markers significantly elevated 1744 indicating bone resorption. She has elevated bone specific alkaline phosphatase as well. Overall concerning for primary hyperparathyroidism causing resorption and turnover of bone and causing osteoporosis.   # Weight management -Patient was following with Dr. Von in this clinic, endocrinologist for a long time for weight management.  She was initially tried with Qsymia  however due to lack of insurance coverage she was treated with equivalent doses in generic form.  She has been on long-term phentermine  and Topamax , were started in 2014.  She has been on phentermine  30 mg daily, dose was increased from 15 to 30 mg in June 2025 and Topamax  50 mg 2 tablets in the morning and 1 tablet in the evening.  Based on office note from prior endocrinologist Dr. Von she preferred to take 3 tablets of Topamax  instead of 2 tablets daily.  With the phentermine  and Topamax  patient has improved satiety, had significant weight loss when regimen was started.  She has been able to maintain weight loss when taking this combination of medications.  Patient states whenever she stopped taking this medication, she gains weight back rapidly.  No side effect with this regimen such as increased heart rate, nausea or drowsiness. Baseline BMI was 33 and improved to BMI of 23 with the medications.  She has been trying to follow a low-fat diet and avoid excessive portion of carbohydrates. Because of the hip pain she has not been able to do exercise or walking.  She is trying to do some chair exercises.  Weight has been fluctuating based  on her compliance with diet.  # Thyroid  nodule: Patient has isthmus nodule measuring 2.4 x 1.6 x 2.0 cm partially cystic with ultrasound in April 05, 2013, she underwent FNA of this nodule in July 2014 with  benign cytology.   - Ultrasound thyroid  in October 2025 : Stable Isthmus thyroid  nodule 2.3 cm, this nodule was biopsied with benign cytology in the past. Right thyroid  nodule measuring 1.7 cm and other bilateral thyroid  nodules with no worrisome features.   She has occasional low TSH with normal free T4 and free T3 and most of the time normal thyroid  function test in the past. She has not been on thyroid  medication in the past.  She has not had a nuclear thyroid  scan.  She had normal TRAb in the past.  Interval history  Patient presented for the follow-up visit recently regarding recent laboratory evaluation for hypercalcemia and osteoporosis.  As reviewed above she has significantly elevated CTX and bone specific alkaline phosphate is concerning for bone resorption and osteoporosis with hypercalcemia overall concerning for primary hyperparathyroidism.  She has no fragility fracture.  She has normalized vitamin D  level on vitamin D  supplement.  She has been taking phentermine  and Topamax  separately for weight loss and weight management.  No other complaints today.  Vit D 1 tab daily.   Dietary calcium.    REVIEW OF SYSTEMS:  As per history of present illness.   PAST MEDICAL HISTORY: Past Medical History:  Diagnosis Date   Anemia    Dysmenorrhea    Glaucoma    Herpes    History of kidney stones    Thyroid  nodule    bx 04-2013    PAST SURGICAL HISTORY: Past Surgical History:  Procedure Laterality Date   BREAST EXCISIONAL BIOPSY Left 2004   BREAST SURGERY     biospy   EXTRACORPOREAL SHOCK WAVE LITHOTRIPSY Left 11/03/2016   Procedure: LEFT EXTRACORPOREAL SHOCK WAVE LITHOTRIPSY (ESWL);  Surgeon: Donnice Brooks, MD;  Location: [facility];  Service: Urology;  Laterality: Left;   FOOT SURGERY Bilateral    NOVASURE ABLATION     TOTAL HIP ARTHROPLASTY Right 12/2023    ALLERGIES: Allergies  Allergen Reactions   Latex Swelling    FAMILY HISTORY:  Family History  Problem  Relation Age of Onset   Hypertension Mother        M- brother    Leukemia Mother        deceased   Glaucoma Father    Hypothyroidism Sister        had thyroid  surgery   Glaucoma Sister    Glaucoma Sister    Diabetes Brother    Glaucoma Brother    HIV Son    Breast cancer Maternal Aunt        aunt-cousin   Breast cancer Cousin    Colon cancer Neg Hx    CAD Neg Hx     SOCIAL HISTORY: Social History   Socioeconomic History   Marital status: Married    Spouse name: Not on file   Number of children: 1   Years of education: Not on file   Highest education level: Associate degree: academic program  Occupational History   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    Employer: [employer]  Tobacco Use   Smoking status: Never    Passive exposure: Never   Smokeless tobacco: Never  Vaping Use   Vaping status: Never Used  Substance and Sexual Activity   Alcohol use: No  Drug use: No   Sexual activity: Yes  Other Topics Concern   Not on file  Social History Narrative   ** Merged History Encounter **       Lives w/ husband, child is at college   Social Drivers of Health   Tobacco Use: Low Risk (11/06/2024)   Patient History    Smoking Tobacco Use: Never    Smokeless Tobacco Use: Never    Passive Exposure: Never  Financial Resource Strain: Low Risk (10/28/2024)   Overall Financial Resource Strain (CARDIA)    Difficulty of Paying Living Expenses: Not hard at all  Food Insecurity: No Food Insecurity (10/28/2024)   Epic    Worried About Programme Researcher, Broadcasting/film/video in the Last Year: Never true    Ran Out of Food in the Last Year: Never true  Transportation Needs: No Transportation Needs (10/28/2024)   Epic    Lack of Transportation (Medical): No    Lack of Transportation (Non-Medical): No  Physical Activity: Insufficiently Active (10/28/2024)   Exercise Vital Sign    Days of Exercise per Week: 2 days    Minutes of Exercise per Session: 20 min   Stress: No Stress Concern Present (10/28/2024)   [employer] of Occupational Health - Occupational Stress Questionnaire    Feeling of Stress: Not at all  Social Connections: Unknown (10/28/2024)   Social Connection and Isolation Panel    Frequency of Communication with Friends and Family: Twice a week    Frequency of Social Gatherings with Friends and Family: Patient declined    Attends Religious Services: Patient declined    Database Administrator or Organizations: No    Attends Engineer, Structural: Not on file    Marital Status: Married  Depression (PHQ2-9): Low Risk (11/04/2024)   Depression (PHQ2-9)    PHQ-2 Score: 0  Alcohol Screen: Not on file  Housing: Unknown (10/28/2024)   Epic    Unable to Pay for Housing in the Last Year: No    Number of Times Moved in the Last Year: Not on file    Homeless in the Last Year: No  Utilities: Not on file  Health Literacy: Not on file    MEDICATIONS:  Current Outpatient Medications  Medication Sig Dispense Refill   Cholecalciferol (VITAMIN D3) 50 MCG (2000 UT) capsule Take 1 capsule (2,000 Units total) by mouth daily.     latanoprost (XALATAN) 0.005 % ophthalmic solution 1 drop at bedtime.     Polyethyl Glyc-Propyl Glyc PF (SYSTANE PRESERVATIVE FREE) 0.4-0.3 % SOLN Apply to eye.     timolol (TIMOPTIC) 0.5 % ophthalmic solution 1 drop every morning.     topiramate  (TOPAMAX ) 50 MG tablet TAKE 1 TABLET BY MOUTH 3 TIMES  DAILY 270 tablet 3   phentermine  30 MG capsule Take 1 capsule (30 mg total) by mouth every morning. 30 capsule 3   No current facility-administered medications for this visit.    PHYSICAL EXAM: Vitals:   11/06/24 1350  BP: 100/60  Pulse: 79  Resp: 16  SpO2: 95%  Weight: 138 lb (62.6 kg)  Height: 5' 5.5 (1.664 m)     Body mass index is 22.62 kg/m.  Wt Readings from Last 3 Encounters:  11/06/24 138 lb (62.6 kg)  11/04/24 139 lb 6 oz (63.2 kg)  09/26/24 140 lb 9.6 oz (63.8 kg)     General: Well  developed, well nourished female in no apparent distress.  HEENT: AT/[place], no external lesions. Hearing intact to the  spoken word Eyes: EOMI. No stare, proptosis. Conjunctiva clear and no icterus. Neck: Trachea midline, neck supple without appreciable thyromegaly or lymphadenopathy and + palpable isthmus thyroid  nodule, mobile and soft. Lungs: Clear to auscultation, no wheeze. Respirations not labored Heart: S1S2, Regular in rate and rhythm. No loud murmurs Abdomen: Soft, non tender, non distended Neurologic: Alert, oriented, normal speech, deep tendon biceps reflexes normal,  no gross focal neurological deficit Extremities: No pedal pitting edema, no tremors of outstretched hands Skin: Warm, color good.  Psychiatric: Does not appear depressed or anxious  PERTINENT HISTORIC LABORATORY AND IMAGING STUDIES:  All pertinent laboratory results were reviewed. Please see HPI also for further details.   TSH  Date Value Ref Range Status  03/25/2024 0.52 0.40 - 4.50 mIU/L Final  11/22/2023 0.34 (L) 0.40 - 4.50 mIU/L Final  05/30/2023 0.62 0.35 - 5.50 uIU/mL Final     ASSESSMENT / PLAN  1. Paget disease of bony pelvis   2. Obesity (BMI 30.0-34.9)   3. Primary hyperparathyroidism   4. Other osteoporosis without current pathological fracture   5. Elevated alkaline phosphatase level   6. Multiple thyroid  nodules     # Hypercalcemia /osteoporosis - She had DEXA scan in January 2024, consistent with osteoporosis with lowest T-score of -3.38 right dual femur all total. - Patient has mild hypercalcemia noted in September 2025, laboratory evaluation showed significantly elevated alkaline phosphatase/bone specific alkaline phosphatase, significantly elevated CTX/bone turnover marker.  She has made normal parathyroid  hormone and normal 24-hour urine calcium.  Normal protein electrophoresis.  Overall laboratory results concerning for primary hyperparathyroidism and osteoporosis.  Plan: - I would like  to check sestamibi parathyroid  scan.  Order placed. - Discussed that if she is found to have parathyroid  adenoma, treatment is parathyroidectomy/surgical treatment. - Will plan for treatment for osteoporosis with antiresorptive therapy after evaluation and management of concerning primary hyperparathyroidism. -Okay to continue current dose of vitamin D  supplement. -Discussed about fall precaution and weightbearing exercise as tolerated.   # Obesity baseline BMI 33 -Patient has been on long-term combination medication phentermine  15 mg daily and Topamax  150 mg total per day.   -She has been able to lose and maintain body weight, current BMI 26.  Brand-name Qsymia  was not covered and not able to afford. -Lately she has not been able to do exercise due to recent right hip surgery. -Her current BMI 23.5, no other cardiovascular risk factors. -Discussed that FDA approved phentermine  for short-term use.  Phentermine  is a stimulant and it is approved for short-term use.  However some studies suggest it can be safe for long-term use as well.  Discussed that regarding phentermine , concern has been raised about addiction and generally recommended for short-term use.  Patient wants to continue phentermine  and Topamax  for long-term use, she understands the risks.  She mentioned that she cannot be off of this medications otherwise she would gain weight rapidly and detrimental for her health.  Plan: - Continue phentermine  30 mg daily.  Continue Topamax  150 mg total per day.  # Thyroid  nodules -Patient has isthmus thyroid  nodule measuring 2.4 cm, ultrasound in June 2014, status post FNA with benign cytology.  She occasionally had abnormal thyroid  function test.  She is mostly euthyroid and not on thyroid  medication. - Ultrasound thyroid  in October 2025 stable isthmus nodule measuring 2.3 cm, right thyroid  nodule 1.7 cm and bilateral oral nodules with no worrisome features. - Please nodule can be monitored with  serial ultrasound, will plan to repeat ultrasound thyroid   in around 1 year around fall of 2026.  # Hypercholesterolemia : -Mildly elevated LDL , other lipids level were in the acceptable range.  Not a candidate for pharmacological treatment at this time.  Will continue to monitor.   Kathleen Burton was seen today for hyperparathyroidism.  Diagnoses and all orders for this visit:  Paget disease of bony pelvis -     XR Pelvis 1-2 Views -     Alkaline phosphatase  Obesity (BMI 30.0-34.9) -     phentermine  30 MG capsule; Take 1 capsule (30 mg total) by mouth every morning.  Primary hyperparathyroidism -     Basic metabolic panel with GFR -     Albumin -     Phosphorus -     Parathyroid  hormone, intact (no Ca) -     Alkaline phosphatase  Other osteoporosis without current pathological fracture  Elevated alkaline phosphatase level -     Alkaline phosphatase  Multiple thyroid  nodules  Other orders -     0.9 %  sodium chloride  infusion -     alteplase (CATHFLO ACTIVASE) injection 2 mg -     heparin lock flush 100 unit/mL -     sodium chloride  flush (NS) 0.9 % injection 10 mL -     sodium chloride  flush (NS) 0.9 % injection 3 mL -     anticoagulant sodium citrate solution 5 mL -     heparin lock flush 100 unit/mL -     acetaminophen (TYLENOL) tablet 650 mg -     diphenhydrAMINE  (BENADRYL ) capsule 25 mg -     zoledronic acid (RECLAST) injection 5 mg -     famotidine (PEPCID) IVPB 20 mg premix -     0.9 %  sodium chloride  infusion -     methylPREDNISolone sodium succinate (SOLU-MEDROL) 125 mg/2 mL injection 125 mg -     diphenhydrAMINE  (BENADRYL ) injection 50 mg -     albuterol (VENTOLIN HFA) 108 (90 Base) MCG/ACT inhaler 2 puff -     EPINEPHrine (EPI-PEN) injection 0.3 mg    DISPOSITION Follow up in clinic in 2 months suggested.  All questions answered and patient verbalized understanding of the plan.  Vinal Rosengrant, MD [facility] Endocrinology [facility] [address] [place], [address] [place], [place] [address] Phone # [phone]  At least part of this note was generated using voice recognition software. Inadvertent word errors may have occurred, which were not recognized during the proofreading process. "

## 2024-11-07 ENCOUNTER — Other Ambulatory Visit (HOSPITAL_COMMUNITY): Payer: Self-pay | Admitting: Endocrinology

## 2024-11-07 ENCOUNTER — Encounter: Payer: Self-pay | Admitting: Endocrinology

## 2024-11-07 ENCOUNTER — Telehealth: Payer: Self-pay

## 2024-11-07 LAB — PARATHYROID HORMONE, INTACT (NO CA): PTH: 48 pg/mL (ref 16–77)

## 2024-11-07 LAB — PHOSPHORUS: Phosphorus: 3.6 mg/dL (ref 2.5–4.5)

## 2024-11-07 LAB — BASIC METABOLIC PANEL WITH GFR
BUN: 16 mg/dL (ref 7–25)
CO2: 22 mmol/L (ref 20–32)
Calcium: 11.3 mg/dL — ABNORMAL HIGH (ref 8.6–10.4)
Chloride: 114 mmol/L — ABNORMAL HIGH (ref 98–110)
Creat: 0.84 mg/dL (ref 0.50–1.05)
Glucose, Bld: 78 mg/dL (ref 65–99)
Potassium: 5.1 mmol/L (ref 3.5–5.3)
Sodium: 143 mmol/L (ref 135–146)
eGFR: 79 mL/min/1.73m2

## 2024-11-07 LAB — ALBUMIN: Albumin: 4.5 g/dL (ref 3.6–5.1)

## 2024-11-07 LAB — ALKALINE PHOSPHATASE: Alkaline phosphatase (APISO): 194 U/L — ABNORMAL HIGH (ref 37–153)

## 2024-11-07 MED ORDER — PHENTERMINE HCL 30 MG PO CAPS: 30.0000 mg | ORAL_CAPSULE | ORAL | 3 refills | Status: AC

## 2024-11-07 NOTE — Telephone Encounter (Signed)
 Sent to [facility].

## 2024-11-07 NOTE — Telephone Encounter (Signed)
 Patient's controlled med sent to wrong place. MD made aware

## 2024-11-08 ENCOUNTER — Telehealth: Payer: Self-pay | Admitting: Pharmacy Technician

## 2024-11-08 ENCOUNTER — Ambulatory Visit: Payer: Self-pay | Admitting: Endocrinology

## 2024-11-08 ENCOUNTER — Encounter: Admitting: Internal Medicine

## 2024-11-08 NOTE — Telephone Encounter (Signed)
" °  [name], the patient will be scheduled as soon as possible.   Auth Submission: NO AUTH NEEDED Site of care: Site of care: [facility] Payer: [employer] commercial  Medication & CPT/J Code(s) submitted: Reclast (Zolendronic acid) [id number] Diagnosis Code: M81.0, M88.88 Route of submission (phone, fax, portal): phone Phone # [phone] Fax # Auth type: Buy/Bill PB Units/visits requested: 5mg  x 1 Reference number: [id number] Approval from: 11/08/2024 to 10/16/2025      "

## 2024-11-19 ENCOUNTER — Ambulatory Visit

## 2024-11-19 VITALS — BP 101/68 | HR 63 | Temp 97.7°F | Resp 14 | Ht 65.5 in | Wt 136.8 lb

## 2024-11-19 DIAGNOSIS — M810 Age-related osteoporosis without current pathological fracture: Secondary | ICD-10-CM | POA: Diagnosis not present

## 2024-11-19 DIAGNOSIS — R748 Abnormal levels of other serum enzymes: Secondary | ICD-10-CM | POA: Diagnosis not present

## 2024-11-25 ENCOUNTER — Ambulatory Visit

## 2025-02-13 ENCOUNTER — Ambulatory Visit: Admitting: Endocrinology

## 2025-05-19 ENCOUNTER — Ambulatory Visit

## 2025-11-07 ENCOUNTER — Encounter: Admitting: Internal Medicine
